# Patient Record
Sex: Male | Born: 1945 | Race: White | Hispanic: No | Marital: Single | Smoking: Former smoker
Health system: Southern US, Community
[De-identification: ages and names within clinical notes are randomized; demographics above are authoritative.]

## PROBLEM LIST (undated history)

## (undated) DIAGNOSIS — G629 Polyneuropathy, unspecified: Secondary | ICD-10-CM

## (undated) DIAGNOSIS — E7800 Pure hypercholesterolemia, unspecified: Secondary | ICD-10-CM

## (undated) DIAGNOSIS — G709 Myoneural disorder, unspecified: Secondary | ICD-10-CM

## (undated) DIAGNOSIS — I10 Essential (primary) hypertension: Secondary | ICD-10-CM

## (undated) DIAGNOSIS — G8929 Other chronic pain: Secondary | ICD-10-CM

## (undated) DIAGNOSIS — E119 Type 2 diabetes mellitus without complications: Secondary | ICD-10-CM

## (undated) DIAGNOSIS — E1140 Type 2 diabetes mellitus with diabetic neuropathy, unspecified: Secondary | ICD-10-CM

## (undated) HISTORY — PX: CHOLECYSTECTOMY: SHX55

## (undated) HISTORY — DX: Essential (primary) hypertension: I10

## (undated) HISTORY — PX: HERNIA REPAIR: SHX51

## (undated) HISTORY — DX: Type 2 diabetes mellitus with diabetic neuropathy, unspecified: E11.40

## (undated) HISTORY — DX: Pure hypercholesterolemia, unspecified: E78.00

---

## 2014-12-09 ENCOUNTER — Encounter (HOSPITAL_COMMUNITY): Payer: Self-pay

## 2014-12-09 ENCOUNTER — Emergency Department (HOSPITAL_COMMUNITY)
Admission: EM | Admit: 2014-12-09 | Discharge: 2014-12-09 | Disposition: A | Discharge status: Home / Self Care | Payer: Medicare Other | Attending: Emergency Medicine | Admitting: Emergency Medicine

## 2014-12-09 DIAGNOSIS — E7800 Pure hypercholesterolemia, unspecified: Secondary | ICD-10-CM | POA: Diagnosis not present

## 2014-12-09 DIAGNOSIS — G629 Polyneuropathy, unspecified: Secondary | ICD-10-CM | POA: Diagnosis not present

## 2014-12-09 DIAGNOSIS — Z760 Encounter for issue of repeat prescription: Secondary | ICD-10-CM | POA: Diagnosis not present

## 2014-12-09 DIAGNOSIS — I10 Essential (primary) hypertension: Secondary | ICD-10-CM | POA: Diagnosis not present

## 2014-12-09 DIAGNOSIS — Z794 Long term (current) use of insulin: Secondary | ICD-10-CM | POA: Diagnosis not present

## 2014-12-09 DIAGNOSIS — Z7982 Long term (current) use of aspirin: Secondary | ICD-10-CM | POA: Insufficient documentation

## 2014-12-09 DIAGNOSIS — E119 Type 2 diabetes mellitus without complications: Secondary | ICD-10-CM | POA: Diagnosis not present

## 2014-12-09 DIAGNOSIS — Z79899 Other long term (current) drug therapy: Secondary | ICD-10-CM | POA: Insufficient documentation

## 2014-12-09 DIAGNOSIS — G8929 Other chronic pain: Secondary | ICD-10-CM | POA: Insufficient documentation

## 2014-12-09 HISTORY — DX: Type 2 diabetes mellitus without complications: E11.9

## 2014-12-09 HISTORY — DX: Pure hypercholesterolemia, unspecified: E78.00

## 2014-12-09 HISTORY — DX: Other chronic pain: G89.29

## 2014-12-09 HISTORY — DX: Polyneuropathy, unspecified: G62.9

## 2014-12-09 HISTORY — DX: Essential (primary) hypertension: I10

## 2014-12-09 NOTE — ED Notes (Signed)
Pt reports is on opana for chronic pain for neuropathy.  Pt says he is prescribed opana but is unable to get a refill because his pcp is unavailable due to medical reasons.  Pt says has tried to get a prescription through the 2 other physicians that his pcp's office referred him to but they would not prescribe it.  Spoke with edp's and was told that they do not prescribe opana.  Notified pt and pt says he needs something comparable or he would,  "check myself in to the hospital for pain."

## 2014-12-09 NOTE — Discharge Instructions (Signed)
We are not able to refill the medication you are requesting for your pain. You will need to see your regular physician or the physician covering for him for medication.

## 2014-12-09 NOTE — ED Provider Notes (Signed)
CSN: [id number]     Arrival date & time 12/09/14  1434 History   First MD Initiated Contact with Patient 12/09/14 1633     Chief Complaint  Patient presents with  . Medication Refill     (Consider location/radiation/quality/duration/timing/severity/associated sxs/prior Treatment) The history is provided by the patient.   Allen Nichols is a 69 y.o. male who presents to the ED with chronic pain. He reports that he has been taking opana for chronic neuropathic pain but his PCP is out of work due to medical reasons and the partners in the practice refuse to prescribe the medication. Patient is here for medication refill. Hx of diabetes, HTN and currently taking Neurontin and Cymbalta in addition to the [place].  Past Medical History  Diagnosis Date  . Neuropathy ([place])   . Diabetes mellitus without complication ([place])   . Hypertension   . Hypercholesterolemia   . Chronic pain    Past Surgical History  Procedure Laterality Date  . Cholecystectomy    . Hernia repair     No family history on file. Social History  Substance Use Topics  . Smoking status: Never Smoker   . Smokeless tobacco: None  . Alcohol Use: No    Review of Systems Negative except as stated in HPI   Allergies  Review of patient's allergies indicates no known allergies.  Home Medications   Prior to Admission medications   Medication Sig Start Date End Date Taking? Authorizing Provider  aspirin EC 81 MG tablet Take 81 mg by mouth every evening.   Yes Historical Provider, MD  DULoxetine (CYMBALTA) 60 MG capsule Take 60 mg by mouth daily. 12/03/14  Yes Historical Provider, MD  gabapentin (NEURONTIN) 600 MG tablet Take 600 mg by mouth 4 (four) times daily.   Yes Historical Provider, MD  insulin NPH-regular Human (NOVOLIN 70/30) (70-30) 100 UNIT/ML injection Inject 20-45 Units into the skin. 45 units in the morning and evening with meals and 20 units at lunch   Yes Historical Provider, MD  lisinopril  (PRINIVIL,ZESTRIL) 10 MG tablet Take 10 mg by mouth 2 (two) times daily. 11/30/14  Yes Historical Provider, MD  OPANA ER, CRUSH RESISTANT, 10 MG T12A 12 hr tablet Take 10 mg by mouth every 12 (twelve) hours. 11/03/14  Yes Historical Provider, MD  rosuvastatin (CRESTOR) 20 MG tablet Take 20 mg by mouth every evening.  12/02/14  Yes Historical Provider, MD   BP 153/88 mmHg  Pulse 84  Temp(Src) 99 F (37.2 C) (Oral)  Resp 18  Ht 5\' 9"  (1.753 m)  Wt 92.987 kg  BMI 30.26 kg/m2  SpO2 99% Physical Exam  Constitutional: He is oriented to person, place, and time. He appears well-developed and well-nourished.  HENT:  Head: Normocephalic.  Neck: Neck supple.  Cardiovascular: Normal rate.   Pulmonary/Chest: Effort normal.  Neurological: He is alert and oriented to person, place, and time. No cranial nerve deficit.  Skin: Skin is warm and dry.  Psychiatric: He has a normal mood and affect. His behavior is normal.  Nursing note and vitals reviewed.   ED Course  Procedures  I discussed with the patient that I am unable to write Rx for Opana and will have Dr. Kathrynn Humble speak with him.  Dr. Kathrynn Humble in to discuss with the patient need for follow up with his PCP or the physicians covering for him to have his Rx refilled. Patient voices understanding.   MDM  69 y.o. male with request for refill of his Opana. Discussed  with patient unable to fill this medication.   Final diagnoses:  Encounter for medication refill       Ashley Murrain, NP 12/10/14 [id number]  Varney Biles, MD 12/10/14 [phone]

## 2016-04-04 ENCOUNTER — Encounter (INDEPENDENT_AMBULATORY_CARE_PROVIDER_SITE_OTHER): Payer: Self-pay | Admitting: Internal Medicine

## 2016-04-04 NOTE — Progress Notes (Signed)
Cardiology Office Note   Date:  04/06/2016   ID:  Allen Nichols, DOB [birth year], MRN [id number]  PCP:  Robert Bellow, MD  Cardiologist:   Jenkins Rouge, MD   No chief complaint on file.     History of Present Illness: Allen Nichols is a 71 y.o. male who presents for evaluation/consultation of cardiovascular risk. Referred by Dr Karie Kirks  Concern for  Long standing DM and age with elevated lipids and HTN. Poor BS control has lead to peripheral neuropathy. Sees Dr Forde Dandy for his diabetes Has constipation likely from opioid use and is being referred to [facility] for colonoscopy No history of documented cardiovascular disease MI, TIA, stroke , claudication or chest pain   Labs reviewed 11/17 Cr 1.88  LDL 112 HDL 50 A1c 10.4  Quit smoking over 20 years ago.  Sedentary has neuropathy in feet and hands No chest pain  Past Medical History:  Diagnosis Date  . Chronic pain   . Diabetes mellitus without complication ([place])   . Diabetic neuropathy ([place]) 04/05/2016  . Essential hypertension, benign 04/05/2016  . High cholesterol 04/05/2016  . Hypercholesterolemia   . Hypertension   . Neuropathy [facility])     Past Surgical History:  Procedure Laterality Date  . CHOLECYSTECTOMY    . HERNIA REPAIR       Current Outpatient Prescriptions  Medication Sig Dispense Refill  . aspirin EC 81 MG tablet Take 81 mg by mouth every evening.    . DULoxetine (CYMBALTA) 60 MG capsule Take 60 mg by mouth daily.    Marland Kitchen gabapentin (NEURONTIN) 600 MG tablet Take 600 mg by mouth 4 (four) times daily.    . insulin NPH-regular Human (NOVOLIN 70/30) (70-30) 100 UNIT/ML injection Inject 20-45 Units into the skin. 100units in am and 100pm    . linaclotide (LINZESS) 290 MCG CAPS capsule Take 1 capsule (290 mcg total) by mouth daily before breakfast. 30 capsule 6  . lisinopril (PRINIVIL,ZESTRIL) 10 MG tablet Take 10 mg by mouth 2 (two) times daily.    . OPANA ER, CRUSH RESISTANT, 10 MG T12A 12 hr tablet Take  10 mg by mouth every 12 (twelve) hours.    . rosuvastatin (CRESTOR) 20 MG tablet Take 20 mg by mouth every evening.      No current facility-administered medications for this visit.     Allergies:   Patient has no known allergies.    Social History:  The patient  reports that he quit smoking about 27 years ago. His smoking use included Cigarettes. He started smoking about 56 years ago. He smoked 2.00 packs per day. He has never used smokeless tobacco. He reports that he does not drink alcohol or use drugs.   Family History:  Mother with diabetes    ROS:  Please see the history of present illness.   Otherwise, review of systems are positive for none.   All other systems are reviewed and negative.    PHYSICAL EXAM: VS:  BP 126/64 (BP Location: Left Arm)   Pulse 92   Wt 230 lb (104.3 kg)   SpO2 99%   BMI 33.00 kg/m  , BMI Body mass index is 33 kg/m. Affect appropriate Healthy:  appears stated age 74: normal Neck supple with no adenopathy JVP normal no bruits no thyromegaly Lungs clear with no wheezing and good diaphragmatic motion Heart:  S1/S2 no murmur, no rub, gallop or click PMI normal Abdomen: benighn, BS positve, no tenderness, no AAA no bruit.  No  HSM or HJR Distal pulses intact with no bruits No edema Neuro non-focal Skin warm and dry No muscular weakness    EKG:  SR rate 86 RSR'l low voltage    Recent Labs: No results found for requested labs within last 8760 hours.    Lipid Panel No results found for: CHOL, TRIG, HDL, CHOLHDL, VLDL, LDLCALC, LDLDIRECT    Wt Readings from Last 3 Encounters:  04/06/16 230 lb (104.3 kg)  04/05/16 228 lb 14.4 oz (103.8 kg)  12/09/14 205 lb (93 kg)      Other studies Reviewed: Additional studies/ records that were reviewed today include: Notes Dr Karie Kirks ECG and labs .    ASSESSMENT AND PLAN:  1.  DM:  Discussed low carb diet.  Target hemoglobin A1c is 6.5 or less.  Continue current medications. Rx limited by  finances in past.  2. HTN:  Well controlled.  Continue current medications and low sodium Dash type diet.   3. CAD: high risk given poorly controlled DM will order f/u exercise myovue  4. Chol on statin labs with Dr Karie Kirks   Current medicines are reviewed at length with the patient today.  The patient concerns regarding medicines.  The following changes have been made:  no change  Labs/ tests ordered today include: Ex Myovue   Orders Placed This Encounter  Procedures  . EKG 12-Lead     Disposition:   FU with [place] PRN      Signed, Jenkins Rouge, MD  04/06/2016 2:13 PM    [facility] [address], [place], [place]  [address] Phone: [phone]; Fax: [phone]

## 2016-04-05 ENCOUNTER — Encounter (INDEPENDENT_AMBULATORY_CARE_PROVIDER_SITE_OTHER): Payer: Self-pay | Admitting: Internal Medicine

## 2016-04-05 ENCOUNTER — Ambulatory Visit (INDEPENDENT_AMBULATORY_CARE_PROVIDER_SITE_OTHER): Payer: Medicare HMO | Admitting: Internal Medicine

## 2016-04-05 ENCOUNTER — Ambulatory Visit (HOSPITAL_COMMUNITY)
Admission: RE | Admit: 2016-04-05 | Discharge: 2016-04-05 | Disposition: A | Discharge status: Home / Self Care | Payer: Medicare HMO | Source: Ambulatory Visit | Attending: Internal Medicine | Admitting: Internal Medicine

## 2016-04-05 ENCOUNTER — Encounter (INDEPENDENT_AMBULATORY_CARE_PROVIDER_SITE_OTHER): Payer: Self-pay

## 2016-04-05 VITALS — BP 128/64 | HR 96 | Temp 97.0°F | Ht 70.0 in | Wt 228.9 lb

## 2016-04-05 DIAGNOSIS — E7800 Pure hypercholesterolemia, unspecified: Secondary | ICD-10-CM

## 2016-04-05 DIAGNOSIS — E785 Hyperlipidemia, unspecified: Secondary | ICD-10-CM

## 2016-04-05 DIAGNOSIS — K5909 Other constipation: Secondary | ICD-10-CM

## 2016-04-05 DIAGNOSIS — E1159 Type 2 diabetes mellitus with other circulatory complications: Secondary | ICD-10-CM | POA: Insufficient documentation

## 2016-04-05 DIAGNOSIS — E1140 Type 2 diabetes mellitus with diabetic neuropathy, unspecified: Secondary | ICD-10-CM

## 2016-04-05 DIAGNOSIS — I10 Essential (primary) hypertension: Secondary | ICD-10-CM | POA: Diagnosis not present

## 2016-04-05 DIAGNOSIS — M549 Dorsalgia, unspecified: Secondary | ICD-10-CM | POA: Insufficient documentation

## 2016-04-05 DIAGNOSIS — E1169 Type 2 diabetes mellitus with other specified complication: Secondary | ICD-10-CM | POA: Insufficient documentation

## 2016-04-05 DIAGNOSIS — Z1211 Encounter for screening for malignant neoplasm of colon: Secondary | ICD-10-CM

## 2016-04-05 HISTORY — DX: Pure hypercholesterolemia, unspecified: E78.00

## 2016-04-05 HISTORY — DX: Type 2 diabetes mellitus with diabetic neuropathy, unspecified: E11.40

## 2016-04-05 HISTORY — DX: Essential (primary) hypertension: I10

## 2016-04-05 MED ORDER — LINACLOTIDE 290 MCG PO CAPS: 290.0000 ug | ORAL_CAPSULE | Freq: Every day | ORAL | 6 refills | Status: DC

## 2016-04-05 NOTE — Patient Instructions (Signed)
Colonoscopy.  The risks and benefits such as perforation, bleeding, and infection were reviewed with the patient and is agreeable. 

## 2016-04-05 NOTE — Progress Notes (Signed)
   Subjective:    Patient ID: Allen Nichols, male    DOB: 05-Nov-1945, 71 y.o.   MRN: [id number]  HPI Referred by DR. Knowlton for constipation/colonoscopy. His last colonoscopy was about 9-10 yrs ago and he reports it was normal.  He says he is due for a colonoscopy. He has a BM about every two. He will take a Doculax and he will have a BM in 2-3 days. No melena or BRRB.  Appetite is good. He has had weight gain.  Per records, hx of drug induced constipation. Hx of constipation since GB removed 8 yrs ago.  Does not exercise.   HA1C 10.4, H and H 14.2 and 43.4     Review of Systems Past Medical History:  Diagnosis Date  . Chronic pain   . Diabetes mellitus without complication ([place])   . Diabetic neuropathy ([place]) 04/05/2016  . Essential hypertension, benign 04/05/2016  . High cholesterol 04/05/2016  . Hypercholesterolemia   . Hypertension   . Neuropathy [facility])     Past Surgical History:  Procedure Laterality Date  . CHOLECYSTECTOMY    . HERNIA REPAIR      No Known Allergies  Current Outpatient Prescriptions on File Prior to Visit  Medication Sig Dispense Refill  . aspirin EC 81 MG tablet Take 81 mg by mouth every evening.    . DULoxetine (CYMBALTA) 60 MG capsule Take 60 mg by mouth daily.    Marland Kitchen gabapentin (NEURONTIN) 600 MG tablet Take 600 mg by mouth 4 (four) times daily.    . insulin NPH-regular Human (NOVOLIN 70/30) (70-30) 100 UNIT/ML injection Inject 20-45 Units into the skin. 100units in am and 100pm    . lisinopril (PRINIVIL,ZESTRIL) 10 MG tablet Take 10 mg by mouth 2 (two) times daily.    . OPANA ER, CRUSH RESISTANT, 10 MG T12A 12 hr tablet Take 10 mg by mouth every 12 (twelve) hours.    . rosuvastatin (CRESTOR) 20 MG tablet Take 20 mg by mouth every evening.      No current facility-administered medications on file prior to visit.        Objective:   Physical Exam Blood pressure 128/64, pulse 96, temperature 97 F (36.1 C), height 5\' 10"  (1.778 m), weight 228 lb  14.4 oz (103.8 kg). Alert and oriented. Skin warm and dry. Oral mucosa is moist.   . Sclera anicteric, conjunctivae is pink. Thyroid not enlarged. No cervical lymphadenopathy. Lungs clear. Heart regular rate and rhythm.  Abdomen is soft. Bowel sounds are positive. No hepatomegaly. No abdominal masses felt. No tenderness. BS +.  Abdomen distended but not tense.  No edema to lower extremities.        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 Am going to get a KUB. Rsd for LInzess 275mcg daily. Samples x 3 of Linzess given to patient.  Screening colonoscopy.  Am going to start him on LInzess 290mg  daily. Colonoscopy.The risks and benefits such as perforation, bleeding, and infection were reviewed with the patient and is agreeable.

## 2016-04-06 ENCOUNTER — Ambulatory Visit (INDEPENDENT_AMBULATORY_CARE_PROVIDER_SITE_OTHER): Payer: Medicare HMO | Admitting: Cardiovascular Disease

## 2016-04-06 ENCOUNTER — Encounter: Payer: Self-pay | Admitting: Cardiovascular Disease

## 2016-04-06 ENCOUNTER — Telehealth (INDEPENDENT_AMBULATORY_CARE_PROVIDER_SITE_OTHER): Payer: Self-pay | Admitting: *Deleted

## 2016-04-06 ENCOUNTER — Encounter (INDEPENDENT_AMBULATORY_CARE_PROVIDER_SITE_OTHER): Payer: Self-pay | Admitting: *Deleted

## 2016-04-06 VITALS — BP 126/64 | HR 92 | Wt 230.0 lb

## 2016-04-06 DIAGNOSIS — Z1211 Encounter for screening for malignant neoplasm of colon: Secondary | ICD-10-CM | POA: Insufficient documentation

## 2016-04-06 DIAGNOSIS — R0600 Dyspnea, unspecified: Secondary | ICD-10-CM | POA: Diagnosis not present

## 2016-04-06 DIAGNOSIS — I10 Essential (primary) hypertension: Secondary | ICD-10-CM | POA: Diagnosis not present

## 2016-04-06 MED ORDER — PEG 3350-KCL-NA BICARB-NACL 420 G PO SOLR: 4000.0000 mL | Freq: Once | ORAL | 0 refills | Status: AC

## 2016-04-06 NOTE — Patient Instructions (Signed)
Your physician recommends that you schedule a follow-up appointment in: as needed.we will call you with test results     Your physician has requested that you have en exercise stress myoview. For further information please visit [email or web address]. Please follow instruction sheet, as given.       Your physician recommends that you continue on your current medications as directed. Please refer to the Current Medication list given to you today.      Thank you for choosing [facility] !

## 2016-04-06 NOTE — Telephone Encounter (Signed)
Patient needs trilyte 

## 2016-04-19 ENCOUNTER — Other Ambulatory Visit (HOSPITAL_COMMUNITY): Payer: Medicare HMO

## 2016-04-19 ENCOUNTER — Encounter (HOSPITAL_COMMUNITY): Payer: Medicare HMO

## 2016-04-28 NOTE — Patient Instructions (Signed)
Allen Nichols  04/28/2016     @PREFPERIOPPHARMACY @   Your procedure is scheduled on  05/05/2016   Report to [facility] at  [place].M.  Call this number if you have problems the morning of surgery:  [phone]   Remember:  Do not eat food or drink liquids after midnight.  Take these medicines the morning of surgery with A SIP OF WATER  cymbalta, neurontin, lsinopril, OpanaER (if needed). Take 1/2 of your usual insulin dosage the night before your procedure. DO NOT take any medication for diabetes the morning of your procedure.   Do not wear jewelry, make-up or nail polish.  Do not wear lotions, powders, or perfumes, or deoderant.  Do not shave 48 hours prior to surgery.  Men may shave face and neck.  Do not bring valuables to the hospital.  [facility] is not responsible for any belongings or valuables.  Contacts, dentures or bridgework may not be worn into surgery.  Leave your suitcase in the car.  After surgery it may be brought to your room.  For patients admitted to the hospital, discharge time will be determined by your treatment team.  Patients discharged the day of surgery will not be allowed to drive home.   Name and phone number of your driver:   Family   Special instructions:  Follow the diet and prep instructions given to you by Dr Olevia Perches office.  Please read over the following fact sheets that you were given. Anesthesia Post-op Instructions and Care and Recovery After Surgery       Colonoscopy, Adult A colonoscopy is an exam to look at the entire large intestine. During the exam, a lubricated, bendable tube is inserted into the anus and then passed into the rectum, colon, and other parts of the large intestine. A colonoscopy is often done as a part of normal colorectal screening or in response to certain symptoms, such as anemia, persistent diarrhea, abdominal pain, and blood in the stool. The exam can help screen for and diagnose medical  problems, including:  Tumors.  Polyps.  Inflammation.  Areas of bleeding. Tell a health care provider about:  Any allergies you have.  All medicines you are taking, including vitamins, herbs, eye drops, creams, and over-the-counter medicines.  Any problems you or family members have had with anesthetic medicines.  Any blood disorders you have.  Any surgeries you have had.  Any medical conditions you have.  Any problems you have had passing stool. What are the risks? Generally, this is a safe procedure. However, problems may occur, including:  Bleeding.  A tear in the intestine.  A reaction to medicines given during the exam.  Infection (rare). What happens before the procedure? Eating and drinking restrictions  Follow instructions from your health care provider about eating and drinking, which may include:  A few days before the procedure - follow a low-fiber diet. Avoid nuts, seeds, dried fruit, raw fruits, and vegetables.  1-3 days before the procedure - follow a clear liquid diet. Drink only clear liquids, such as clear broth or bouillon, black coffee or tea, clear juice, clear soft drinks or sports drinks, gelatin dessert, and popsicles. Avoid any liquids that contain red or purple dye.  On the day of the procedure - do not eat or drink anything during the 2 hours before the procedure, or within the time period that your health care provider recommends. Bowel prep  If you were prescribed an oral bowel prep to clean out your colon:  Take it as told by your health care provider. Starting the day before your procedure, you will need to drink a large amount of medicated liquid. The liquid will cause you to have multiple loose stools until your stool is almost clear or light green.  If your skin or anus gets irritated from diarrhea, you may use these to relieve the irritation:  Medicated wipes, such as adult wet wipes with aloe and vitamin E.  A skin  soothing-product like petroleum jelly.  If you vomit while drinking the bowel prep, take a break for up to 60 minutes and then begin the bowel prep again. If vomiting continues and you cannot take the bowel prep without vomiting, call your health care provider. General instructions   Ask your health care provider about changing or stopping your regular medicines. This is especially important if you are taking diabetes medicines or blood thinners.  Plan to have someone take you home from the hospital or clinic. What happens during the procedure?  An IV tube may be inserted into one of your veins.  You will be given medicine to help you relax (sedative).  To reduce your risk of infection:  Your health care team will wash or sanitize their hands.  Your anal area will be washed with soap.  You will be asked to lie on your side with your knees bent.  Your health care provider will lubricate a long, thin, flexible tube. The tube will have a camera and a light on the end.  The tube will be inserted into your anus.  The tube will be gently eased through your rectum and colon.  Air will be delivered into your colon to keep it open. You may feel some pressure or cramping.  The camera will be used to take images during the procedure.  A small tissue sample may be removed from your body to be examined under a microscope (biopsy). If any potential problems are found, the tissue will be sent to a lab for testing.  If small polyps are found, your health care provider may remove them and have them checked for cancer cells.  The tube that was inserted into your anus will be slowly removed. The procedure may vary among health care providers and hospitals. What happens after the procedure?  Your blood pressure, heart rate, breathing rate, and blood oxygen level will be monitored until the medicines you were given have worn off.  Do not drive for 24 hours after the exam.  You may have a small  amount of blood in your stool.  You may pass gas and have mild abdominal cramping or bloating due to the air that was used to inflate your colon during the exam.  It is up to you to get the results of your procedure. Ask your health care provider, or the department performing the procedure, when your results will be ready. This information is not intended to replace advice given to you by your health care provider. Make sure you discuss any questions you have with your health care provider. Document Released: 12/17/1999 Document Revised: 10/20/2015 Document Reviewed: 03/02/2015 Elsevier Interactive Patient Education  2017 [employer]  Colonoscopy, Adult, Care After This sheet gives you information about how to care for yourself after your procedure. Your health care provider may also give you more specific instructions. If you have problems or questions, contact your health care provider. What can  I expect after the procedure? After the procedure, it is common to have:  A small amount of blood in your stool for 24 hours after the procedure.  Some gas.  Mild abdominal cramping or bloating. Follow these instructions at home: General instructions    For the first 24 hours after the procedure:  Do not drive or use machinery.  Do not sign important documents.  Do not drink alcohol.  Do your regular daily activities at a slower pace than normal.  Eat soft, easy-to-digest foods.  Rest often.  Take over-the-counter or prescription medicines only as told by your health care provider.  It is up to you to get the results of your procedure. Ask your health care provider, or the department performing the procedure, when your results will be ready. Relieving cramping and bloating   Try walking around when you have cramps or feel bloated.  Apply heat to your abdomen as told by your health care provider. Use a heat source that your health care provider recommends, such as a moist heat  pack or a heating pad.  Place a towel between your skin and the heat source.  Leave the heat on for 20-30 minutes.  Remove the heat if your skin turns bright red. This is especially important if you are unable to feel pain, heat, or cold. You may have a greater risk of getting burned. Eating and drinking   Drink enough fluid to keep your urine clear or pale yellow.  Resume your normal diet as instructed by your health care provider. Avoid heavy or fried foods that are hard to digest.  Avoid drinking alcohol for as long as instructed by your health care provider. Contact a health care provider if:  You have blood in your stool 2-3 days after the procedure. Get help right away if:  You have more than a small spotting of blood in your stool.  You pass large blood clots in your stool.  Your abdomen is swollen.  You have nausea or vomiting.  You have a fever.  You have increasing abdominal pain that is not relieved with medicine. This information is not intended to replace advice given to you by your health care provider. Make sure you discuss any questions you have with your health care provider. Document Released: 08/03/2003 Document Revised: 09/13/2015 Document Reviewed: 03/02/2015 Elsevier Interactive Patient Education  2017 [place] Anesthesia is a term that refers to techniques, procedures, and medicines that help a person stay safe and comfortable during a medical procedure. Monitored anesthesia care, or sedation, is one type of anesthesia. Your anesthesia specialist may recommend sedation if you will be having a procedure that does not require you to be unconscious, such as:  Cataract surgery.  A dental procedure.  A biopsy.  A colonoscopy. During the procedure, you may receive a medicine to help you relax (sedative). There are three levels of sedation:  Mild sedation. At this level, you may feel awake and relaxed. You will be able to  follow directions.  Moderate sedation. At this level, you will be sleepy. You may not remember the procedure.  Deep sedation. At this level, you will be asleep. You will not remember the procedure. The more medicine you are given, the deeper your level of sedation will be. Depending on how you respond to the procedure, the anesthesia specialist may change your level of sedation or the type of anesthesia to fit your needs. An anesthesia specialist will monitor you  closely during the procedure. Let your health care provider know about:  Any allergies you have.  All medicines you are taking, including vitamins, herbs, eye drops, creams, and over-the-counter medicines.  Any use of steroids (by mouth or as a cream).  Any problems you or family members have had with sedatives and anesthetic medicines.  Any blood disorders you have.  Any surgeries you have had.  Any medical conditions you have, such as sleep apnea.  Whether you are pregnant or may be pregnant.  Any use of cigarettes, alcohol, or street drugs. What are the risks? Generally, this is a safe procedure. However, problems may occur, including:  Getting too much medicine (oversedation).  Nausea.  Allergic reaction to medicines.  Trouble breathing. If this happens, a breathing tube may be used to help with breathing. It will be removed when you are awake and breathing on your own.  Heart trouble.  Lung trouble. Before the procedure Staying hydrated  Follow instructions from your health care provider about hydration, which may include:  Up to 2 hours before the procedure - you may continue to drink clear liquids, such as water, clear fruit juice, black coffee, and plain tea. Eating and drinking restrictions  Follow instructions from your health care provider about eating and drinking, which may include:  8 hours before the procedure - stop eating heavy meals or foods such as meat, fried foods, or fatty foods.  6  hours before the procedure - stop eating light meals or foods, such as toast or cereal.  6 hours before the procedure - stop drinking milk or drinks that contain milk.  2 hours before the procedure - stop drinking clear liquids. Medicines  Ask your health care provider about:  Changing or stopping your regular medicines. This is especially important if you are taking diabetes medicines or blood thinners.  Taking medicines such as aspirin and ibuprofen. These medicines can thin your blood. Do not take these medicines before your procedure if your health care provider instructs you not to. Tests and exams  You will have a physical exam.  You may have blood tests done to show:  How well your kidneys and liver are working.  How well your blood can clot.  General instructions  Plan to have someone take you home from the hospital or clinic.  If you will be going home right after the procedure, plan to have someone with you for 24 hours. What happens during the procedure?  Your blood pressure, heart rate, breathing, level of pain and overall condition will be monitored.  An IV tube will be inserted into one of your veins.  Your anesthesia specialist will give you medicines as needed to keep you comfortable during the procedure. This may mean changing the level of sedation.  The procedure will be performed. After the procedure  Your blood pressure, heart rate, breathing rate, and blood oxygen level will be monitored until the medicines you were given have worn off.  Do not drive for 24 hours if you received a sedative.  You may:  Feel sleepy, clumsy, or nauseous.  Feel forgetful about what happened after the procedure.  Have a sore throat if you had a breathing tube during the procedure.  Vomit. This information is not intended to replace advice given to you by your health care provider. Make sure you discuss any questions you have with your health care provider. Document  Released: 09/14/2004 Document Revised: 05/28/2015 Document Reviewed: 04/11/2015 Elsevier Interactive Patient Education  2017 [place], Care After These instructions provide you with information about caring for yourself after your procedure. Your health care provider may also give you more specific instructions. Your treatment has been planned according to current medical practices, but problems sometimes occur. Call your health care provider if you have any problems or questions after your procedure. What can I expect after the procedure? After your procedure, it is common to:  Feel sleepy for several hours.  Feel clumsy and have poor balance for several hours.  Feel forgetful about what happened after the procedure.  Have poor judgment for several hours.  Feel nauseous or vomit.  Have a sore throat if you had a breathing tube during the procedure. Follow these instructions at home: For at least 24 hours after the procedure:    Do not:  Participate in activities in which you could fall or become injured.  Drive.  Use heavy machinery.  Drink alcohol.  Take sleeping pills or medicines that cause drowsiness.  Make important decisions or sign legal documents.  Take care of children on your own.  Rest. Eating and drinking   Follow the diet that is recommended by your health care provider.  If you vomit, drink water, juice, or soup when you can drink without vomiting.  Make sure you have little or no nausea before eating solid foods. General instructions   Have a responsible adult stay with you until you are awake and alert.  Take over-the-counter and prescription medicines only as told by your health care provider.  If you smoke, do not smoke without supervision.  Keep all follow-up visits as told by your health care provider. This is important. Contact a health care provider if:  You keep feeling nauseous or you keep  vomiting.  You feel light-headed.  You develop a rash.  You have a fever. Get help right away if:  You have trouble breathing. This information is not intended to replace advice given to you by your health care provider. Make sure you discuss any questions you have with your health care provider. Document Released: 04/11/2015 Document Revised: 08/11/2015 Document Reviewed: 04/11/2015 Elsevier Interactive Patient Education  2017 [employer].

## 2016-05-02 ENCOUNTER — Encounter (HOSPITAL_COMMUNITY)
Admission: RE | Admit: 2016-05-02 | Discharge: 2016-05-02 | Disposition: A | Discharge status: Home / Self Care | Payer: Medicare HMO | Source: Ambulatory Visit | Attending: Internal Medicine | Admitting: Internal Medicine

## 2016-05-02 ENCOUNTER — Encounter (HOSPITAL_COMMUNITY): Payer: Self-pay

## 2016-05-05 ENCOUNTER — Ambulatory Visit (HOSPITAL_COMMUNITY): Admission: RE | Admit: 2016-05-05 | Payer: Medicare HMO | Source: Ambulatory Visit | Admitting: Internal Medicine

## 2016-05-05 ENCOUNTER — Encounter (HOSPITAL_COMMUNITY): Admission: RE | Payer: Self-pay | Source: Ambulatory Visit

## 2016-05-05 SURGERY — COLONOSCOPY WITH PROPOFOL
Anesthesia: Monitor Anesthesia Care

## 2016-05-25 ENCOUNTER — Encounter (INDEPENDENT_AMBULATORY_CARE_PROVIDER_SITE_OTHER): Payer: Self-pay

## 2017-10-24 ENCOUNTER — Ambulatory Visit (HOSPITAL_COMMUNITY)
Admission: RE | Admit: 2017-10-24 | Discharge: 2017-10-24 | Disposition: A | Discharge status: Home / Self Care | Payer: Medicare HMO | Source: Ambulatory Visit | Attending: Family Medicine | Admitting: Family Medicine

## 2017-10-24 ENCOUNTER — Other Ambulatory Visit (HOSPITAL_COMMUNITY): Payer: Self-pay | Admitting: Family Medicine

## 2017-10-24 DIAGNOSIS — R110 Nausea: Secondary | ICD-10-CM | POA: Diagnosis present

## 2017-10-24 DIAGNOSIS — D367 Benign neoplasm of other specified sites: Secondary | ICD-10-CM | POA: Diagnosis not present

## 2018-05-01 ENCOUNTER — Encounter (HOSPITAL_COMMUNITY): Payer: Self-pay

## 2018-05-01 ENCOUNTER — Observation Stay (HOSPITAL_COMMUNITY)
Admission: EM | Admit: 2018-05-01 | Discharge: 2018-05-02 | Disposition: A | Discharge status: Home / Self Care | Payer: Medicare HMO | Attending: Family Medicine | Admitting: Family Medicine

## 2018-05-01 ENCOUNTER — Emergency Department (HOSPITAL_COMMUNITY): Payer: Medicare HMO

## 2018-05-01 ENCOUNTER — Other Ambulatory Visit: Payer: Self-pay

## 2018-05-01 DIAGNOSIS — S01312A Laceration without foreign body of left ear, initial encounter: Secondary | ICD-10-CM | POA: Diagnosis present

## 2018-05-01 DIAGNOSIS — Z79899 Other long term (current) drug therapy: Secondary | ICD-10-CM | POA: Diagnosis not present

## 2018-05-01 DIAGNOSIS — Z87891 Personal history of nicotine dependence: Secondary | ICD-10-CM | POA: Diagnosis not present

## 2018-05-01 DIAGNOSIS — Y92512 Supermarket, store or market as the place of occurrence of the external cause: Secondary | ICD-10-CM | POA: Diagnosis not present

## 2018-05-01 DIAGNOSIS — Z794 Long term (current) use of insulin: Secondary | ICD-10-CM | POA: Insufficient documentation

## 2018-05-01 DIAGNOSIS — G8929 Other chronic pain: Secondary | ICD-10-CM | POA: Insufficient documentation

## 2018-05-01 DIAGNOSIS — Z7982 Long term (current) use of aspirin: Secondary | ICD-10-CM | POA: Insufficient documentation

## 2018-05-01 DIAGNOSIS — N183 Chronic kidney disease, stage 3 unspecified: Secondary | ICD-10-CM | POA: Diagnosis present

## 2018-05-01 DIAGNOSIS — E785 Hyperlipidemia, unspecified: Secondary | ICD-10-CM | POA: Diagnosis not present

## 2018-05-01 DIAGNOSIS — I951 Orthostatic hypotension: Secondary | ICD-10-CM | POA: Diagnosis present

## 2018-05-01 DIAGNOSIS — E1169 Type 2 diabetes mellitus with other specified complication: Secondary | ICD-10-CM | POA: Diagnosis present

## 2018-05-01 DIAGNOSIS — W1839XA Other fall on same level, initial encounter: Secondary | ICD-10-CM | POA: Insufficient documentation

## 2018-05-01 DIAGNOSIS — E7800 Pure hypercholesterolemia, unspecified: Secondary | ICD-10-CM | POA: Diagnosis not present

## 2018-05-01 DIAGNOSIS — E1122 Type 2 diabetes mellitus with diabetic chronic kidney disease: Secondary | ICD-10-CM | POA: Diagnosis not present

## 2018-05-01 DIAGNOSIS — I129 Hypertensive chronic kidney disease with stage 1 through stage 4 chronic kidney disease, or unspecified chronic kidney disease: Secondary | ICD-10-CM | POA: Diagnosis not present

## 2018-05-01 DIAGNOSIS — E1140 Type 2 diabetes mellitus with diabetic neuropathy, unspecified: Secondary | ICD-10-CM | POA: Insufficient documentation

## 2018-05-01 DIAGNOSIS — I10 Essential (primary) hypertension: Secondary | ICD-10-CM | POA: Diagnosis not present

## 2018-05-01 DIAGNOSIS — R55 Syncope and collapse: Secondary | ICD-10-CM | POA: Diagnosis present

## 2018-05-01 DIAGNOSIS — E1159 Type 2 diabetes mellitus with other circulatory complications: Secondary | ICD-10-CM | POA: Diagnosis present

## 2018-05-01 LAB — CBC
HCT: 40.7 % (ref 39.0–52.0)
Hemoglobin: 13.1 g/dL (ref 13.0–17.0)
MCH: 29 pg (ref 26.0–34.0)
MCHC: 32.2 g/dL (ref 30.0–36.0)
MCV: 90 fL (ref 80.0–100.0)
Platelets: 319 10*3/uL (ref 150–400)
RBC: 4.52 MIL/uL (ref 4.22–5.81)
RDW: 14.6 % (ref 11.5–15.5)
WBC: 9.7 10*3/uL (ref 4.0–10.5)
nRBC: 0 % (ref 0.0–0.2)

## 2018-05-01 LAB — BASIC METABOLIC PANEL
Anion gap: 8 (ref 5–15)
BUN: 24 mg/dL — ABNORMAL HIGH (ref 8–23)
CO2: 27 mmol/L (ref 22–32)
Calcium: 9.3 mg/dL (ref 8.9–10.3)
Chloride: 101 mmol/L (ref 98–111)
Creatinine, Ser: 1.58 mg/dL — ABNORMAL HIGH (ref 0.61–1.24)
GFR calc Af Amer: 50 mL/min — ABNORMAL LOW (ref >60)
GFR calc non Af Amer: 43 mL/min — ABNORMAL LOW (ref >60)
Glucose, Bld: 181 mg/dL — ABNORMAL HIGH (ref 70–99)
Potassium: 5 mmol/L (ref 3.5–5.1)
Sodium: 136 mmol/L (ref 135–145)

## 2018-05-01 LAB — HEMOGLOBIN A1C
Hgb A1c MFr Bld: 7.4 % — ABNORMAL HIGH (ref 4.8–5.6)
Mean Plasma Glucose: 165.68 mg/dL

## 2018-05-01 LAB — TROPONIN I: Troponin I: <0.03 ng/mL (ref <0.03)

## 2018-05-01 LAB — GLUCOSE, CAPILLARY: Glucose-Capillary: 72 mg/dL (ref 70–99)

## 2018-05-01 LAB — CBG MONITORING, ED: Glucose-Capillary: 252 mg/dL — ABNORMAL HIGH (ref 70–99)

## 2018-05-01 MED ORDER — DULOXETINE HCL 60 MG PO CPEP
60.0000 mg | ORAL_CAPSULE | Freq: Two times a day (BID) | ORAL | Status: DC
  Administered 2018-05-01 – 2018-05-02 (×2): 60 mg via ORAL
  Filled 2018-05-01 (×2): qty 1

## 2018-05-01 MED ORDER — INSULIN ASPART PROT & ASPART (70-30 MIX) 100 UNIT/ML ~~LOC~~ SUSP
50.0000 [IU] | Freq: Two times a day (BID) | SUBCUTANEOUS | Status: DC
  Administered 2018-05-02: 50 [IU] via SUBCUTANEOUS
  Filled 2018-05-01: qty 10

## 2018-05-01 MED ORDER — SODIUM CHLORIDE 0.9% FLUSH: 3.0000 mL | Freq: Once | INTRAVENOUS | Status: DC

## 2018-05-01 MED ORDER — ACETAMINOPHEN 650 MG RE SUPP: 650.0000 mg | Freq: Four times a day (QID) | RECTAL | Status: DC | PRN

## 2018-05-01 MED ORDER — SODIUM CHLORIDE 0.9 % IV BOLUS
1000.0000 mL | Freq: Once | INTRAVENOUS | Status: AC
  Administered 2018-05-01: 1000 mL via INTRAVENOUS

## 2018-05-01 MED ORDER — OXYCODONE HCL ER 10 MG PO T12A
20.0000 mg | EXTENDED_RELEASE_TABLET | Freq: Two times a day (BID) | ORAL | Status: DC
  Administered 2018-05-01 – 2018-05-02 (×2): 20 mg via ORAL
  Filled 2018-05-01 (×2): qty 2

## 2018-05-01 MED ORDER — ROSUVASTATIN CALCIUM 20 MG PO TABS
20.0000 mg | ORAL_TABLET | Freq: Every evening | ORAL | Status: DC
  Administered 2018-05-01: 20 mg via ORAL
  Filled 2018-05-01: qty 1

## 2018-05-01 MED ORDER — SODIUM CHLORIDE 0.9 % IV SOLN
INTRAVENOUS | Status: AC
  Administered 2018-05-01 – 2018-05-02 (×2): via INTRAVENOUS

## 2018-05-01 MED ORDER — INSULIN ASPART 100 UNIT/ML ~~LOC~~ SOLN: 0.0000 [IU] | Freq: Three times a day (TID) | SUBCUTANEOUS | Status: DC

## 2018-05-01 MED ORDER — INSULIN ASPART 100 UNIT/ML ~~LOC~~ SOLN: 0.0000 [IU] | Freq: Every day | SUBCUTANEOUS | Status: DC

## 2018-05-01 MED ORDER — ACETAMINOPHEN 325 MG PO TABS: 650.0000 mg | ORAL_TABLET | Freq: Four times a day (QID) | ORAL | Status: DC | PRN

## 2018-05-01 MED ORDER — LIDOCAINE HCL (PF) 1 % IJ SOLN
5.0000 mL | Freq: Once | INTRAMUSCULAR | Status: DC
  Filled 2018-05-01: qty 30

## 2018-05-01 MED ORDER — TETANUS-DIPHTH-ACELL PERTUSSIS 5-2.5-18.5 LF-MCG/0.5 IM SUSP
0.5000 mL | Freq: Once | INTRAMUSCULAR | Status: AC
  Administered 2018-05-01: 0.5 mL via INTRAMUSCULAR
  Filled 2018-05-01: qty 0.5

## 2018-05-01 MED ORDER — SODIUM CHLORIDE 0.9% FLUSH: 3.0000 mL | Freq: Two times a day (BID) | INTRAVENOUS | Status: DC

## 2018-05-01 NOTE — H&P (Signed)
History and Physical    Allen Nichols WNI:[id number] DOB: 1945-06-20 DOA: 05/01/2018  PCP: Lemmie Evens, MD  Patient coming from: [place]  I have personally briefly reviewed patient's old medical records in [place]  Chief Complaint: Syncope  HPI: Allen Nichols is a 73 y.o. male with medical history significant for insulin-dependent diabetes, hypertension, hyperlipidemia, and chronic pain presents to the ED after a syncopal episode.  Patient states earlier this morning around 8 AM he had an episode of lightheadedness when he stood up and walked to the kitchen from a lying position in which time he had a brace himself on the counter.  He did not fall or lose consciousness at that time.  This afternoon he went to the [employer] gas station.  He did not have the driver seat of his truck and was preparing to pump gas when he all of a sudden blacked out and fell.  He awoke approximately 1 minute later on the ground fully aware.  He had apparently hit his head as evidenced by a left ear laceration.    At the time of this episode, he denied any associated nausea, vomiting, chest pain, palpitations, dyspnea, loss of bowel/bladder control, focal weakness, sensory change, or confusion.  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  He says he has been feeling more thirsty than usual and says that he really does not drink any water, mostly soda.  He takes insulin for his diabetes and denies any recent low blood sugars.  Upon further questioning he does report similar episodes of lightheadedness occasionally when changing positions from lying or sitting to standing.  He denies any recent medication changes.  He denies any fevers, chills, diaphoresis, sick contacts, or recent travel.  He denies any dysuria or increased urinary frequency.  ED Course:  Initial vitals showed BP 103/66, pulse 95, RR 20, temp 97.8 Fahrenheit, SPO2 100% on room air.   Orthostatic vital signs were positive.  Labs are notable for BUN 24, creatinine 1.58, GFR 43, (2017: Creatinine 1.88, GFR 36) serum glucose 181, troponin I negative.  CBC was within normal limits.  CT head without contrast was negative for acute intracranial abnormalities, but did show atrophy with minimal small vessel chronic ischemic changes of deep cerebral white matter.  Chest x-ray with left-sided rib view were negative for displaced rib fracture, focal consolidation, effusion, or pneumothorax.  Patient was given 1 L normal saline and the hospitalist service was consulted for further evaluation and management of syncope.  Review of Systems: As per HPI otherwise 10 point review of systems negative.    Past Medical History:  Diagnosis Date   Chronic pain    Diabetes mellitus without complication ([place])    Diabetic neuropathy ([place]) 04/05/2016   Essential hypertension, benign 04/05/2016   High cholesterol 04/05/2016   Hypercholesterolemia    Hypertension    Neuropathy     Past Surgical History:  Procedure Laterality Date   CHOLECYSTECTOMY     HERNIA REPAIR       reports that he quit smoking about 29 years ago. His smoking use included cigarettes. He started smoking about 58 years ago. He smoked 2.00 packs per day. He has never used smokeless tobacco. He reports that he does not drink alcohol or use drugs.  No Known Allergies  History reviewed. No pertinent family history.   Prior to Admission medications   Medication Sig Start Date End Date Taking? Authorizing Provider  aspirin EC  81 MG tablet Take 81 mg by mouth every evening.    [provider]  DULoxetine (CYMBALTA) 60 MG capsule Take 60 mg by mouth daily. 12/03/14   [provider]  gabapentin (NEURONTIN) 600 MG tablet Take 600 mg by mouth 4 (four) times daily.    [provider]  insulin NPH-regular Human (NOVOLIN 70/30) (70-30) 100 UNIT/ML injection Inject 20-45 Units into the skin.  100units in am and 100pm    [provider]  linaclotide (LINZESS) 290 MCG CAPS capsule Take 1 capsule (290 mcg total) by mouth daily before breakfast. 04/05/16   Setzer, Rona Ravens, NP  lisinopril (PRINIVIL,ZESTRIL) 10 MG tablet Take 10 mg by mouth 2 (two) times daily. 11/30/14   [provider]  OPANA ER, CRUSH RESISTANT, 10 MG T12A 12 hr tablet Take 10 mg by mouth every 12 (twelve) hours. 11/03/14   [provider]  rosuvastatin (CRESTOR) 20 MG tablet Take 20 mg by mouth every evening.  12/02/14   [provider]    Physical Exam: Vitals:   05/01/18 1535 05/01/18 1800 05/01/18 1830 05/01/18 1900  BP:  117/62 126/64 136/76  Pulse:  88 84 86  Resp:  15    Temp:      TempSrc:      SpO2:  95% 95% 98%  Weight: 106.6 kg     Height: 5\' 10"  (1.778 m)       Constitutional: Elderly man resting supine in bed, NAD, calm, comfortable Head: Left ear laceration s/p repair with sutures in place Eyes: PERRL, EOMI, lids and conjunctivae normal ENMT: Mucous membranes are dry. Posterior pharynx clear of any exudate or lesions. Neck: normal, supple, no masses. Respiratory: clear to auscultation bilaterally, no wheezing, no crackles. Normal respiratory effort. No accessory muscle use.  Cardiovascular: Regular rate and rhythm, no murmurs / rubs / gallops. No extremity edema. 2+ pedal pulses. Abdomen: no tenderness, no masses palpated. No hepatosplenomegaly. Bowel sounds positive.  Musculoskeletal: no clubbing / cyanosis. No joint deformity upper and lower extremities. Good ROM, no contractures. Normal muscle tone.  Skin: Left ear laceration with sutures in place Neurologic: CN 2-12 grossly intact. Sensation intact, DTR normal. Strength 5/5 in all 4.  Slight intention tremor present without dysmetria. Psychiatric: Normal judgment and insight. Alert and oriented x 3. Normal mood.     Labs on Admission: I have personally reviewed following labs and imaging  studies  CBC: Recent Labs  Lab 05/01/18 1806  WBC 9.7  HGB 13.1  HCT 40.7  MCV 90.0  PLT 902   Basic Metabolic Panel: Recent Labs  Lab 05/01/18 1806  NA 136  K 5.0  CL 101  CO2 27  GLUCOSE 181*  BUN 24*  CREATININE 1.58*  CALCIUM 9.3   GFR: Estimated Creatinine Clearance: 52.4 mL/min (A) (by C-G formula based on SCr of 1.58 mg/dL (H)). Liver Function Tests: No results for input(s): AST, ALT, ALKPHOS, BILITOT, PROT, ALBUMIN in the last 168 hours. No results for input(s): LIPASE, AMYLASE in the last 168 hours. No results for input(s): AMMONIA in the last 168 hours. Coagulation Profile: No results for input(s): INR, PROTIME in the last 168 hours. Cardiac Enzymes: Recent Labs  Lab 05/01/18 1808  TROPONINI <0.03   BNP (last 3 results) No results for input(s): PROBNP in the last 8760 hours. HbA1C: No results for input(s): HGBA1C in the last 72 hours. CBG: Recent Labs  Lab 05/01/18 1557  GLUCAP 252*   Lipid Profile: No results for input(s):  CHOL, HDL, LDLCALC, TRIG, CHOLHDL, LDLDIRECT in the last 72 hours. Thyroid Function Tests: No results for input(s): TSH, T4TOTAL, FREET4, T3FREE, THYROIDAB in the last 72 hours. Anemia Panel: No results for input(s): VITAMINB12, FOLATE, FERRITIN, TIBC, IRON, RETICCTPCT in the last 72 hours. Urine analysis: No results found for: COLORURINE, APPEARANCEUR, [place], [place], [place], HGBUR, BILIRUBINUR, KETONESUR, PROTEINUR, UROBILINOGEN, NITRITE, LEUKOCYTESUR  Radiological Exams on Admission: Dg Ribs Unilateral W/chest Left  Result Date: 05/01/2018 CLINICAL DATA:  Loss of consciousness. Left-sided rib pain. EXAM: LEFT RIBS AND CHEST - 3+ VIEW COMPARISON:  None. FINDINGS: Frontal view of the chest and three views of left-sided ribs. Midline trachea. Normal heart size and mediastinal contours. No pleural effusion or pneumothorax. Clear lungs. No displaced rib fracture. Radiographic marker projects over the 12 left rib. IMPRESSION:  No displaced rib fracture, pleural fluid, or pneumothorax. No acute cardiopulmonary disease. Electronically Signed   By: Abigail Miyamoto M.D.   On: 05/01/2018 17:05   Ct Head Wo Contrast  Result Date: 05/01/2018 CLINICAL DATA:  Head trauma, passed out at [facility] today striking LEFT side of head, headache EXAM: CT HEAD WITHOUT CONTRAST TECHNIQUE: Contiguous axial images were obtained from the base of the skull through the vertex without intravenous contrast. Sagittal and coronal MPR images reconstructed from axial data set. COMPARISON:  None FINDINGS: Brain: Generalized atrophy. Normal ventricular morphology. No midline shift or mass effect. Minimal small vessel chronic ischemic changes of deep cerebral white matter. No intracranial hemorrhage, mass lesion, evidence of acute infarction, or extra-axial fluid collection. Vascular: Minimal atherosclerotic calcifications at the LEFT carotid artery at skull base. Skull: Intact Sinuses/Orbits: Minimal mucous within LEFT sphenoid sinus. Otherwise clear. Other: N/A IMPRESSION: Atrophy with minimal small vessel chronic ischemic changes of deep cerebral white matter. No acute intracranial abnormalities. Electronically Signed   By: Lavonia Dana M.D.   On: 05/01/2018 16:49    EKG: Independently reviewed. NSR, borderline LAD, motion artifact present, no acute ischemic changes.  Assessment/Plan Principal Problem:   Syncope Active Problems:   Hypertension associated with diabetes ([place])   Hyperlipidemia associated with type 2 diabetes mellitus ([place])   Type 2 diabetes mellitus with hyperlipidemia ([place])   CKD (chronic kidney disease), stage III ([place])   Laceration of left ear  Allen Nichols is a 73 y.o. male with medical history significant for insulin-dependent diabetes, hypertension, hyperlipidemia, and chronic pain who is admitted after syncopal episode.  Syncope: Orthostatic vital signs are positive and most likely etiology for syncopal episode in the setting of  inadequate hydration and antihypertensive use.  He denies any chest pain or palpitations.  Troponin and EKG are reassuring. -Admit to telemetry -Continue IV fluids overnight -Hold home lisinopril for now -Cycle cardiac enzymes q6h x 2 -Obtain echocardiogram -Repeat orthostatic vital signs in a.m.  Insulin-dependent type 2 diabetes: He has been taking Novolin 70/30 100 units twice daily with meals and recently started on Ozempic weekly. -Check A1c -Reduced NovoLog 70/30 50 units BIDAC, adjust as needed  Hypertension: -Hold home lisinopril for now with orthostatic hypotension  ?CKD stage III: BUN 24, creatinine 1.58, GFR 43 on admission, (2017: Creatinine 1.88, GFR 36).  Question CKD versus AKI in the setting of dehydration. -Continue IVFs overnight and recheck chemistry panel in a.m.  Hyperlipidemia: -Continue rosuvastatin  Left ear laceration: Occurred after fall from syncopal episode. -s/p LAC repair in the ED with sutures in place  Chronic pain and diabetic neuropathy: He is prescribed Xtampza ER 18 mg #60/month and Cymbalta 60 mg twice daily. -  Continue home [place] [facility] with hold parameters -Continue Cymbalta   DVT prophylaxis: SCDs  Code Status: Full code, confirmed with patient Family Communication: None present on admission Disposition Plan: Likely discharge to home pending clinical progress Consults called: None Admission status: Observation   Zada Finders MD [facility] Pager [phone]  If 7PM-7AM, please contact night-coverage [email or web address]  05/01/2018, 8:29 PM

## 2018-05-01 NOTE — ED Notes (Signed)
Attempted to call report for room 1426 nurse not assigned floor will return call when bed assigned.

## 2018-05-01 NOTE — ED Provider Notes (Signed)
Medical screening examination/treatment/procedure(s) were conducted as a shared visit with non-physician practitioner(s) and myself.  I personally evaluated the patient during the encounter.  EKG Interpretation  Date/Time:  Wednesday May 01 2018 15:33:06 EDT Ventricular Rate:  96 PR Interval:    QRS Duration: 96 QT Interval:  350 QTC Calculation: 443 R Axis:   -19 Text Interpretation:  Sinus rhythm Borderline left axis deviation Confirmed by Lacretia Leigh ([id number]) on 05/01/2018 3:35:44 PM 73 year old male presents after having syncopal event just prior to arrival.  Had episode of dizziness earlier today which resolved spontaneously.  On exam here he has no focal deficits.  Will obtain imaging as well as blood work and admit to the hospital service   Lacretia Leigh, MD 05/01/18 1623

## 2018-05-01 NOTE — ED Provider Notes (Signed)
[facility] DEPT Provider Note   CSN: [id number] Arrival date & time: 05/01/18  1520    History   Chief Complaint Chief Complaint  Patient presents with  . Loss of Consciousness    HPI Allen Nichols is a 73 y.o. male with a hx of HTN, hypercholesterolemia, diabetes mellitus w/ neuropathy, and former tobacco abuse who presents to the ED s/p syncopal episode which occurred just PTA. Patient states that he got out of his car to pump gas, placed his card inside payment slot, and suddenly passed out. He was not feeling dizzy, lightheaded, nauseaed or having chest pain, palpitations, or dyspnea immediately prior to the fall, he states he all the sudden woke up to the sound of horn honking while on the ground. LOC was brief, less than 30 seconds. Woke up feeling mostly at baseline, but did have L ear laceration & some discomfort to L posterior ribs as he collapsed to his left. Ambulatory since and feeling fairly well. He does mention that earlier today mid morning he returned from the grocery store and had an episode of lightheadedness/dizziness where he had to steady himself after position change, no syncope occurred at that time and he did not have these sxs just prior to passing out at [employer]. Otherwise no hx of similar. Denies fever, chills, numbness, weakness, chang in vision, seizure activity, nausea, vomiting, diarrhea, or abdominal pain. No new medications or recent med dosing changes.     HPI  Past Medical History:  Diagnosis Date  . Chronic pain   . Diabetes mellitus without complication ([place])   . Diabetic neuropathy ([place]) 04/05/2016  . Essential hypertension, benign 04/05/2016  . High cholesterol 04/05/2016  . Hypercholesterolemia   . Hypertension   . Neuropathy     Patient Active Problem List   Diagnosis Date Noted  . Special screening for malignant neoplasms, colon 04/06/2016  . Back pain 04/05/2016  . Diabetic neuropathy ([place]) 04/05/2016  .  Essential hypertension, benign 04/05/2016  . High cholesterol 04/05/2016    Past Surgical History:  Procedure Laterality Date  . CHOLECYSTECTOMY    . HERNIA REPAIR          Home Medications    Prior to Admission medications   Medication Sig Start Date End Date Taking? Authorizing Provider  aspirin EC 81 MG tablet Take 81 mg by mouth every evening.    [provider]  DULoxetine (CYMBALTA) 60 MG capsule Take 60 mg by mouth daily. 12/03/14   [provider]  gabapentin (NEURONTIN) 600 MG tablet Take 600 mg by mouth 4 (four) times daily.    [provider]  insulin NPH-regular Human (NOVOLIN 70/30) (70-30) 100 UNIT/ML injection Inject 20-45 Units into the skin. 100units in am and 100pm    [provider]  linaclotide (LINZESS) 290 MCG CAPS capsule Take 1 capsule (290 mcg total) by mouth daily before breakfast. 04/05/16   Setzer, Rona Ravens, NP  lisinopril (PRINIVIL,ZESTRIL) 10 MG tablet Take 10 mg by mouth 2 (two) times daily. 11/30/14   [provider]  OPANA ER, CRUSH RESISTANT, 10 MG T12A 12 hr tablet Take 10 mg by mouth every 12 (twelve) hours. 11/03/14   [provider]  rosuvastatin (CRESTOR) 20 MG tablet Take 20 mg by mouth every evening.  12/02/14   [provider]    Family History History reviewed. No pertinent family history.  Social History Social History   Tobacco Use  . Smoking status: Former Smoker  Packs/day: 2.00    Types: Cigarettes    Start date: 04/06/1960    Last attempt to quit: 04/06/1989    Years since quitting: 29.0  . Smokeless tobacco: Never Used  Substance Use Topics  . Alcohol use: No  . Drug use: No     Allergies   Patient has no known allergies.   Review of Systems Review of Systems  Constitutional: Negative for chills, diaphoresis and fever.  Eyes: Negative for visual disturbance.  Respiratory: Negative for shortness of breath.   Cardiovascular: Negative for chest pain.   Gastrointestinal: Negative for abdominal pain, diarrhea, nausea and vomiting.  Musculoskeletal:       + for L posterior rib pain  Skin: Positive for wound.  Neurological: Positive for dizziness (earlier this AM, resolved), syncope and light-headedness (earlier this AM resolved). Negative for tremors, seizures, facial asymmetry, speech difficulty, weakness, numbness and headaches.  All other systems reviewed and are negative.  Physical Exam Updated Vital Signs BP 103/66 (BP Location: Right Arm)   Pulse 95   Temp 97.8 F (36.6 C) (Oral)   Resp 20   Ht 5\' 10"  (1.778 m)   Wt 106.6 kg   SpO2 100%   BMI 33.72 kg/m   Physical Exam Vitals signs and nursing note reviewed.  Constitutional:      General: He is not in acute distress.    Appearance: He is well-developed. He is not toxic-appearing.  HENT:     Head: Normocephalic. No raccoon eyes or Battle's sign.     Right Ear: No hemotympanum.     Left Ear: No hemotympanum.     Ears:     Comments: L ear: Irregular laceration noted to the lobule. Most lateral portion is completely torn through anterior to posterior. Almost extends to tragus. Additional extension inferiorly. See below pictures for details. Bleeding controlled. No obvious FB.     Nose: No rhinorrhea.     Mouth/Throat:     Pharynx: Oropharynx is clear. Uvula midline.  Eyes:     General:        Right eye: No discharge.        Left eye: No discharge.     Extraocular Movements: Extraocular movements intact.     Conjunctiva/sclera: Conjunctivae normal.     Pupils: Pupils are equal, round, and reactive to light.  Neck:     Musculoskeletal: Normal range of motion and neck supple. No spinous process tenderness.  Cardiovascular:     Rate and Rhythm: Normal rate and regular rhythm.     Pulses:          Radial pulses are 2+ on the right side and 2+ on the left side.  Pulmonary:     Effort: Pulmonary effort is normal. No respiratory distress.     Breath sounds: Normal breath  sounds. No wheezing, rhonchi or rales.  Chest:     Chest wall: Tenderness (left posterior inferior chest wall tenderness to palpation without ovelrying ecchymosis or palpable crepitus/step off) present.  Abdominal:     General: There is no distension.     Palpations: Abdomen is soft.     Tenderness: There is no abdominal tenderness.  Musculoskeletal:     Comments: UEs: Abrasion noted to L wrist, no active bleeding, no ecchymosis. Intact AROM throughout. No point/focal bony tenderness.  Back: no midline tenderness.  LEs: Normal AROM. No point/focal bony tenderness.   Skin:    General: Skin is warm and dry.     Findings: No  rash.  Neurological:     Mental Status: He is alert.     Comments: Clear speech. CN III-XII grossly intact. Sensation grossly intact to bilateral upper/lower extremities. 5/5 symmetric grip strength. 5/5 strength with plantar/dorsiflexion bilaterally. Negative pronator drift.   Psychiatric:        Behavior: Behavior normal.        ED Treatments / Results  Labs (all labs ordered are listed, but only abnormal results are displayed) Labs Reviewed  BASIC METABOLIC PANEL - Abnormal; Notable for the following components:      Result Value   Glucose, Bld 181 (*)    BUN 24 (*)    Creatinine, Ser 1.58 (*)    GFR calc non Af Amer 43 (*)    GFR calc Af Amer 50 (*)    All other components within normal limits  CBG MONITORING, ED - Abnormal; Notable for the following components:   Glucose-Capillary 252 (*)    All other components within normal limits  CBC  TROPONIN I  URINALYSIS, ROUTINE W REFLEX MICROSCOPIC    EKG EKG Interpretation  Date/Time:  Wednesday May 01 2018 15:33:06 EDT Ventricular Rate:  96 PR Interval:    QRS Duration: 96 QT Interval:  350 QTC Calculation: 443 R Axis:   -19 Text Interpretation:  Sinus rhythm Borderline left axis deviation Confirmed by Lacretia Leigh ([id number]) on 05/01/2018 3:55:15 PM   Radiology Dg Ribs Unilateral W/chest  Left  Result Date: 05/01/2018 CLINICAL DATA:  Loss of consciousness. Left-sided rib pain. EXAM: LEFT RIBS AND CHEST - 3+ VIEW COMPARISON:  None. FINDINGS: Frontal view of the chest and three views of left-sided ribs. Midline trachea. Normal heart size and mediastinal contours. No pleural effusion or pneumothorax. Clear lungs. No displaced rib fracture. Radiographic marker projects over the 12 left rib. IMPRESSION: No displaced rib fracture, pleural fluid, or pneumothorax. No acute cardiopulmonary disease. Electronically Signed   By: Abigail Miyamoto M.D.   On: 05/01/2018 17:05   Ct Head Wo Contrast  Result Date: 05/01/2018 CLINICAL DATA:  Head trauma, passed out at [facility] today striking LEFT side of head, headache EXAM: CT HEAD WITHOUT CONTRAST TECHNIQUE: Contiguous axial images were obtained from the base of the skull through the vertex without intravenous contrast. Sagittal and coronal MPR images reconstructed from axial data set. COMPARISON:  None FINDINGS: Brain: Generalized atrophy. Normal ventricular morphology. No midline shift or mass effect. Minimal small vessel chronic ischemic changes of deep cerebral white matter. No intracranial hemorrhage, mass lesion, evidence of acute infarction, or extra-axial fluid collection. Vascular: Minimal atherosclerotic calcifications at the LEFT carotid artery at skull base. Skull: Intact Sinuses/Orbits: Minimal mucous within LEFT sphenoid sinus. Otherwise clear. Other: N/A IMPRESSION: Atrophy with minimal small vessel chronic ischemic changes of deep cerebral white matter. No acute intracranial abnormalities. Electronically Signed   By: Lavonia Dana M.D.   On: 05/01/2018 16:49    Procedures .Marland KitchenLaceration Repair Date/Time: 05/01/2018 7:13 PM Performed by: Amaryllis Dyke, PA-C Authorized by: Amaryllis Dyke, PA-C   Consent:    Consent obtained:  Verbal   Consent given by:  Patient   Risks discussed:  Infection, need for additional repair, nerve  damage, poor wound healing, poor cosmetic result, retained foreign body, pain, tendon damage and vascular damage   Alternatives discussed:  No treatment Anesthesia (see MAR for exact dosages):    Anesthesia method:  Local infiltration   Local anesthetic:  Lidocaine 1% w/o epi Laceration details:    Location:  Ear  Ear location:  L ear   Length (cm):  2.5 Repair type:    Repair type:  Simple Pre-procedure details:    Preparation:  Patient was prepped and draped in usual sterile fashion Exploration:    Hemostasis achieved with:  Direct pressure   Wound exploration: wound explored through full range of motion and entire depth of wound probed and visualized     Contaminated: no   Treatment:    Area cleansed with:  Betadine   Amount of cleaning:  Standard   Irrigation solution:  Sterile water   Irrigation method:  Pressure wash Skin repair:    Repair method:  Sutures   Suture size:  6-0   Suture material:  Prolene   Suture technique:  Simple interrupted   Number of sutures:  6 (5 anterior, 1 posterior) Approximation:    Approximation:  Close Post-procedure details:    Dressing:  Open (no dressing)   Patient tolerance of procedure:  Tolerated well, no immediate complications   (including critical care time)      Medications Ordered in ED Medications  sodium chloride flush (NS) 0.9 % injection 3 mL (has no administration in time range)    Initial Impression / Assessment and Plan / ED Course  I have reviewed the triage vital signs and the nursing notes.  Pertinent labs & imaging results that were available during my care of the patient were reviewed by me and considered in my medical decision making (see chart for details).   Patient presents to the ED s/p syncope w/ L ear laceration. Nontoxic, no apparent distress, vitals WNL. Exam notable for abnormally shaped L ear laceration & some L posterior chest wall tenderness to palpation without overlying ecchymosis or palpable  crepitus. No midline spinal tenderness. No focal neuro deficits. Primarily concerned for arrhythmia vs. Orthostatic hypotension. Plan for CT head, L rib/CXR, labs, EKG, cardiac monitoring. Orthostatic vitals. Anticipate admission for syncope w/ telemetry.  Work-up reviewed:  CBC: No leukocytosis/anemia.  BMP: BUN/creatinine elevated consistent w/ prerenal- no prior on record. No significant electrolyte derangement.  Trop: Negative EKG: No STEMI.  Orthostatics: positive.   Laceration: pressure irrigated, wound visualized in bloodless field without evidence of FB, repair per note above, will need suture removal in 5-7 days. Tdap updated.   Positive orthostatics, elevated BUN/creatinine, patient states he does not drink much water, likely component of dehydration, suspect this as most likely cause of patient's syncope, will consult for admission for syncope w/ continued monitoring.   19:27: CONSULT: Discussed case with hospitalist Dr. Posey Pronto- accepts admission.   This is a shared visit with supervising physician Dr. Zenia Resides who has independently evaluated patient & provided guidance in evaluation/management/disposition, in agreement with care   Final Clinical Impressions(s) / ED Diagnoses   Final diagnoses:  Syncope, unspecified syncope type  Laceration of left earlobe, initial encounter    ED Discharge Orders    None       Leafy Kindle 05/01/18 2108    Lacretia Leigh, MD 05/01/18 2250

## 2018-05-01 NOTE — ED Triage Notes (Signed)
States was pumping gas and passed out and cut left ear and abrasions to elbows. Left side pain.  And soreness to head voiced alert and oriented x 3. Moves all extremities.

## 2018-05-02 ENCOUNTER — Observation Stay (HOSPITAL_BASED_OUTPATIENT_CLINIC_OR_DEPARTMENT_OTHER): Payer: Medicare HMO

## 2018-05-02 DIAGNOSIS — S01312A Laceration without foreign body of left ear, initial encounter: Secondary | ICD-10-CM | POA: Diagnosis not present

## 2018-05-02 DIAGNOSIS — I951 Orthostatic hypotension: Secondary | ICD-10-CM | POA: Diagnosis present

## 2018-05-02 DIAGNOSIS — R55 Syncope and collapse: Secondary | ICD-10-CM

## 2018-05-02 LAB — URINALYSIS, ROUTINE W REFLEX MICROSCOPIC
Bilirubin Urine: NEGATIVE
Glucose, UA: NEGATIVE mg/dL
Hgb urine dipstick: NEGATIVE
Ketones, ur: NEGATIVE mg/dL
Leukocytes,Ua: NEGATIVE
Nitrite: NEGATIVE
Protein, ur: 30 mg/dL — AB
Specific Gravity, Urine: 1.027 (ref 1.005–1.030)
pH: 5 (ref 5.0–8.0)

## 2018-05-02 LAB — TROPONIN I
Troponin I: <0.03 ng/mL (ref <0.03)
Troponin I: <0.03 ng/mL (ref <0.03)

## 2018-05-02 LAB — BASIC METABOLIC PANEL
Anion gap: 8 (ref 5–15)
BUN: 22 mg/dL (ref 8–23)
CO2: 26 mmol/L (ref 22–32)
Calcium: 8.4 mg/dL — ABNORMAL LOW (ref 8.9–10.3)
Chloride: 103 mmol/L (ref 98–111)
Creatinine, Ser: 1.24 mg/dL (ref 0.61–1.24)
GFR calc Af Amer: >60 mL/min (ref >60)
GFR calc non Af Amer: 58 mL/min — ABNORMAL LOW (ref >60)
Glucose, Bld: 151 mg/dL — ABNORMAL HIGH (ref 70–99)
Potassium: 4.2 mmol/L (ref 3.5–5.1)
Sodium: 137 mmol/L (ref 135–145)

## 2018-05-02 LAB — ECHOCARDIOGRAM COMPLETE
Height: 70 in
Weight: 3608.49 oz

## 2018-05-02 LAB — GLUCOSE, CAPILLARY
Glucose-Capillary: 62 mg/dL — ABNORMAL LOW (ref 70–99)
Glucose-Capillary: 80 mg/dL (ref 70–99)
Glucose-Capillary: 93 mg/dL (ref 70–99)

## 2018-05-02 MED ORDER — PERFLUTREN LIPID MICROSPHERE
1.0000 mL | INTRAVENOUS | Status: AC | PRN
  Administered 2018-05-02: 2 mL via INTRAVENOUS
  Filled 2018-05-02: qty 10

## 2018-05-02 NOTE — Progress Notes (Signed)
Spoke with patient by phone (DM coordinator working remotely).  Reviewed with patient and reviewed insulin regimen with him. Patient is not regularly checking his CBGs so reinforced to check CBGs regularly and if symptomatic of hypoglycemia -reviewed hypoglycemia protocol.  Patient doesn't know what his CBGs have been but plans to restart checking CBGs @ home and reporting to MD on office visits. Patient has appointment @ Dr. Baldwin Crown office in May.  Thank you, Nani Gasser. Clifton Kovacic, RN, MSN, CDE  Diabetes Coordinator Inpatient Glycemic Control Team Team Pager [phone] (8am-5pm) 05/02/2018 2:01 PM

## 2018-05-02 NOTE — Care Management Obs Status (Signed)
[facility] NOTIFICATION   Patient Details  Name: Reymundo Winship MRN: [id number] Date of Birth: [birth year]   Medicare Observation Status Notification Given:  Yes    Purcell Mouton, RN 05/02/2018, 1:43 PM

## 2018-05-02 NOTE — Progress Notes (Signed)
Pt given discharge instructions and all questions answered. Girlfriend will be driving him; home.

## 2018-05-02 NOTE — Discharge Summary (Addendum)
Physician Discharge Summary  Laval Cafaro KXF:[id number] DOB: [birth year] DOA: 05/01/2018  PCP: Lemmie Evens, MD  Admit date: 05/01/2018 Discharge date: 05/02/2018  Admitted From: Home Disposition: Home  Recommendations for Outpatient Follow-up:  1. Follow up with PCP in 1-2 weeks 2. Please obtain BMP/CBC in one week 3. Please follow up on the following pending results:  Home Health: No Equipment/Devices: None  Discharge Condition: Stable CODE STATUS: Full code Diet recommendation: Heart healthy  Brief/Interim Summary: Allen Nichols is a 73 y.o. male with medical history significant for insulin-dependent diabetes, hypertension, hyperlipidemia, and chronic pain presented to the ED on 05/01/2018 after a syncopal episode.  Patient states earlier this morning around 8 AM he had an episode of lightheadedness when he stood up and walked to the kitchen from a lying position in which time he had a brace himself on the counter.  He did not fall or lose consciousness at that time.  This afternoon he went to the [place] gas station.  He did not have the driver seat of his truck and was preparing to pump gas when he all of a sudden blacked out and fell.  He awoke approximately 1 minute later on the ground fully aware.  He had apparently hit his head as evidenced by a left ear laceration.    At the time of this episode, he denied any associated nausea, vomiting, chest pain, palpitations, dyspnea, loss of bowel/bladder control, focal weakness, sensory change, or confusion.  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  He said he has been feeling more thirsty than usual and that he really does not drink any water, but mostly soda.   Upon arrival to the emergency department, he was slightly hypotensive and he was positive for orthostatic hypotension as well.  He was admitted to hospital service for syncope and further work-up and evaluation.  He  received some IV fluid bolus of 1 L in the emergency department and was continued on IV fluids.  He was monitored on telemetry during his short hospitalization and there was no event.  Transthoracic echo did not show any valvular defect either.  When seen earlier today, he had no complaints.  He has not had any more dizziness or any other complaint.  Patient now understands that he needs to drink more water than soda to prevent such episodes in the future.  He is in agreement with the discharge home today.  Discharge Diagnoses:  Principal Problem:   Syncope Active Problems:   Hypertension associated with diabetes ([place])   Hyperlipidemia associated with type 2 diabetes mellitus ([facility])   Type 2 diabetes mellitus with hyperlipidemia ([place])   CKD (chronic kidney disease), stage III ([place])   Laceration of left ear   Orthostatic hypotension    Discharge Instructions  Discharge Instructions    Discharge patient   Complete by:  As directed    Discharge disposition:  01-Home or Self Care   Discharge patient date:  05/02/2018      Follow-up Information    Lemmie Evens, MD Follow up in 1 week(s).   Specialty:  Family Medicine Contact information: [place] [id number] [phone]          No Known Allergies  Allergies as of 05/02/2018      Reactions   Latex Rash   Tape Rash      Consultations:  None   Procedures/Studies: Dg Ribs Unilateral W/chest Left  Result Date: 05/01/2018 CLINICAL  DATA:  Loss of consciousness. Left-sided rib pain. EXAM: LEFT RIBS AND CHEST - 3+ VIEW COMPARISON:  None. FINDINGS: Frontal view of the chest and three views of left-sided ribs. Midline trachea. Normal heart size and mediastinal contours. No pleural effusion or pneumothorax. Clear lungs. No displaced rib fracture. Radiographic marker projects over the 12 left rib. IMPRESSION: No displaced rib fracture, pleural fluid, or pneumothorax. No acute cardiopulmonary disease.  Electronically Signed   By: Abigail Miyamoto M.D.   On: 05/01/2018 17:05   Ct Head Wo Contrast  Result Date: 05/01/2018 CLINICAL DATA:  Head trauma, passed out at [facility] today striking LEFT side of head, headache EXAM: CT HEAD WITHOUT CONTRAST TECHNIQUE: Contiguous axial images were obtained from the base of the skull through the vertex without intravenous contrast. Sagittal and coronal MPR images reconstructed from axial data set. COMPARISON:  None FINDINGS: Brain: Generalized atrophy. Normal ventricular morphology. No midline shift or mass effect. Minimal small vessel chronic ischemic changes of deep cerebral white matter. No intracranial hemorrhage, mass lesion, evidence of acute infarction, or extra-axial fluid collection. Vascular: Minimal atherosclerotic calcifications at the LEFT carotid artery at skull base. Skull: Intact Sinuses/Orbits: Minimal mucous within LEFT sphenoid sinus. Otherwise clear. Other: N/A IMPRESSION: Atrophy with minimal small vessel chronic ischemic changes of deep cerebral white matter. No acute intracranial abnormalities. Electronically Signed   By: Lavonia Dana M.D.   On: 05/01/2018 16:49      Subjective: Seen and examined.  No complaints.  Feels better.   Discharge Exam: Vitals:   05/01/18 2210 05/02/18 0500  BP: (!) 151/80 (!) 170/80  Pulse: 86 74  Resp: 15 14  Temp: 97.9 F (36.6 C) 98.1 F (36.7 C)  SpO2: 100% 99%   Vitals:   05/01/18 2210 05/01/18 2300 05/02/18 0500 05/02/18 0500  BP: (!) 151/80   (!) 170/80  Pulse: 86   74  Resp: 15   14  Temp: 97.9 F (36.6 C)   98.1 F (36.7 C)  TempSrc: Oral   Oral  SpO2: 100%   99%  Weight:  102.4 kg 102.3 kg   Height:  5\' 10"  (1.778 m)      General: Pt is alert, awake, not in acute distress Cardiovascular: RRR, S1/S2 +, no rubs, no gallops Respiratory: CTA bilaterally, no wheezing, no rhonchi Abdominal: Soft, NT, ND, bowel sounds + Extremities: no edema, no cyanosis    The results of significant  diagnostics from this hospitalization (including imaging, microbiology, ancillary and laboratory) are listed below for reference.     Microbiology: No results found for this or any previous visit (from the past 240 hour(s)).   Labs: BNP (last 3 results) No results for input(s): BNP in the last 8760 hours. Basic Metabolic Panel: Recent Labs  Lab 05/01/18 1806 05/02/18 0031  NA 136 137  K 5.0 4.2  CL 101 103  CO2 27 26  GLUCOSE 181* 151*  BUN 24* 22  CREATININE 1.58* 1.24  CALCIUM 9.3 8.4*   Liver Function Tests: No results for input(s): AST, ALT, ALKPHOS, BILITOT, PROT, ALBUMIN in the last 168 hours. No results for input(s): LIPASE, AMYLASE in the last 168 hours. No results for input(s): AMMONIA in the last 168 hours. CBC: Recent Labs  Lab 05/01/18 1806  WBC 9.7  HGB 13.1  HCT 40.7  MCV 90.0  PLT 319   Cardiac Enzymes: Recent Labs  Lab 05/01/18 1808 05/02/18 0031 05/02/18 0457  TROPONINI <0.03 <0.03 <0.03   BNP: Invalid input(s): POCBNP CBG:  Recent Labs  Lab 05/01/18 1557 05/01/18 2159 05/01/18 2240 05/02/18 0456 05/02/18 0904  GLUCAP 252* 62* 72 80 93   D-Dimer No results for input(s): DDIMER in the last 72 hours. Hgb A1c Recent Labs    05/01/18 1806  HGBA1C 7.4*   Lipid Profile No results for input(s): CHOL, HDL, LDLCALC, TRIG, CHOLHDL, LDLDIRECT in the last 72 hours. Thyroid function studies No results for input(s): TSH, T4TOTAL, T3FREE, THYROIDAB in the last 72 hours.  Invalid input(s): FREET3 Anemia work up No results for input(s): VITAMINB12, FOLATE, FERRITIN, TIBC, IRON, RETICCTPCT in the last 72 hours. Urinalysis    Component Value Date/Time   COLORURINE YELLOW 05/02/2018 [place] 05/02/2018 0614   LABSPEC 1.027 05/02/2018 0614   PHURINE 5.0 05/02/2018 0614   GLUCOSEU NEGATIVE 05/02/2018 0614   HGBUR NEGATIVE 05/02/2018 0614   [place] NEGATIVE 05/02/2018 [place] 05/02/2018 0614    PROTEINUR 30 (A) 05/02/2018 0614   NITRITE NEGATIVE 05/02/2018 0614   LEUKOCYTESUR NEGATIVE 05/02/2018 0614   Sepsis Labs Invalid input(s): PROCALCITONIN,  WBC,  LACTICIDVEN Microbiology No results found for this or any previous visit (from the past 240 hour(s)).   Time coordinating discharge: Over 30 minutes  SIGNED:   Darliss Cheney, MD  [facility] 05/02/2018, 2:46 PM Pager [phone]  If 7PM-7AM, please contact night-coverage [email or web address] Password [email or web address]

## 2018-05-02 NOTE — Discharge Instructions (Signed)
Syncope Syncope is when you pass out (faint) for a short time. It is caused by a sudden decrease in blood flow to the brain. Signs that you may be about to pass out include:  Feeling dizzy or light-headed.  Feeling sick to your stomach (nauseous).  Seeing all white or all black.  Having cold, clammy skin. If you pass out, get help right away. Call your local emergency services (911 in the [place] Do not drive yourself to the hospital. Follow these instructions at home: Watch for any changes in your symptoms. Take these actions to stay safe and help with your symptoms: Lifestyle  Do not drive, use machinery, or play sports until your doctor says it is okay.  Do not drink alcohol.  Do not use any products that contain nicotine or tobacco, such as cigarettes and e-cigarettes. If you need help quitting, ask your doctor.  Drink enough fluid to keep your pee (urine) pale yellow. General instructions  Take over-the-counter and prescription medicines only as told by your doctor.  If you are taking blood pressure or heart medicine, sit up and stand up slowly. Spend a few minutes getting ready to sit and then stand. This can help you feel less dizzy.  Have someone stay with you until you feel stable.  If you start to feel like you might pass out, lie down right away and raise (elevate) your feet above the level of your heart. Breathe deeply and steadily. Wait until all of the symptoms are gone.  Keep all follow-up visits as told by your doctor. This is important. Get help right away if:  You have a very bad headache.  You pass out once or more than once.  You have pain in your chest, belly, or back.  You have a very fast or uneven heartbeat (palpitations).  It hurts to breathe.  You are bleeding from your mouth or your bottom (rectum).  You have black or tarry poop (stool).  You have jerky movements that you cannot control (seizure).  You are confused.  You have trouble  walking.  You are very weak.  You have vision problems. These symptoms may be an emergency. Do not wait to see if the symptoms will go away. Get medical help right away. Call your local emergency services (911 in the [place] Do not drive yourself to the hospital. Summary  Syncope is when you pass out (faint) for a short time. It is caused by a sudden decrease in blood flow to the brain.  Signs that you may be about to faint include feeling dizzy, light-headed, or sick to your stomach, seeing all white or all black, or having cold, clammy skin.  If you start to feel like you might pass out, lie down right away and raise (elevate) your feet above the level of your heart. Breathe deeply and steadily. Wait until all of the symptoms are gone. This information is not intended to replace advice given to you by your health care provider. Make sure you discuss any questions you have with your health care provider. Document Released: 06/07/2007 Document Revised: 01/31/2017 Document Reviewed: 01/31/2017 Elsevier Interactive Patient Education  2019 [employer]  

## 2018-05-02 NOTE — Progress Notes (Signed)
  Echocardiogram 2D Echocardiogram has been performed.  Darlina Sicilian M 05/02/2018, 10:24 AM

## 2018-05-02 NOTE — Evaluation (Signed)
Physical Therapy Evaluation Patient Details Name: Allen Nichols MRN: [id number] DOB: 04-23-1945 Today's Date: 05/02/2018   History of Present Illness  73 yo male admitted to ED on 4/9 s/p fall due to syncopal episode. Pt with L ear laceration, sutured closed, and L rib pain. CT head - for acute findings, CXR - for rib fracture or displacement. PMH includes chronic pain, DM, neuropathy, HTN.   Clinical Impression   Pt presents with history of syncopal episode/fall. Pt with WFL strength, mobility, gait, and activity tolerance. Pt with no complaints of dizziness with changes in position as he did on day of fall, states this rarely happens to him. Pt educated on orthostatic hypotension, and PT informed pt to watch for dizziness with supine to sit and sit to stand, and if he feels dizzy, to remain in position until dizziness passes and steady self. Pt also educated on head trauma and vestibular dysfunction. PT told pt that if he began to have vertigo-like symptoms to reach out to PCP. Pt to d/c home with no recommendation for follow-up.     Follow Up Recommendations No PT follow up    Equipment Recommendations  None recommended by PT    Recommendations for Other Services       Precautions / Restrictions Precautions Precautions: Fall Restrictions Weight Bearing Restrictions: No      Mobility  Bed Mobility Overal bed mobility: Needs Assistance Bed Mobility: Supine to Sit;Sit to Supine     Supine to sit: Supervision Sit to supine: Supervision   General bed mobility comments: Supervision for safety, increased time to perform.   Transfers Overall transfer level: Needs assistance Equipment used: None Transfers: Sit to/from Stand Sit to Stand: Supervision         General transfer comment: supervision for safety, pt stood without difficulty.   Ambulation/Gait Ambulation/Gait assistance: Supervision Gait Distance (Feet): 250 Feet Assistive device: IV Pole;None Gait  Pattern/deviations: Step-through pattern;Decreased stride length Gait velocity: WFL   General Gait Details: supervision for safety, pt originally using IV pole to walk, but PT encouraged no UE support to determine safety of gait. Pt with short steps, otherwise gait WNL.  Stairs Stairs: (NT - pt at basline level of mobility, pt to have no difficulty with stair navigation upon d/c home)          Wheelchair Mobility    Modified Rankin (Stroke Patients Only)       Balance Overall balance assessment: Mild deficits observed, not formally tested                           High level balance activites: Head turns;Direction changes High Level Balance Comments: WFL gait with vertical and horizontal head turns, direction changes.             Pertinent Vitals/Pain Pain Assessment: No/denies pain    Home Living Family/patient expects to be discharged to:: Private residence Living Arrangements: Spouse/significant other Available Help at Discharge: Family;Available 24 hours/day(lives with girlfriend) Type of Home: House Home Access: Stairs to enter   [employer] of Steps: 2-4 Home Layout: One level Home Equipment: None      Prior Function Level of Independence: Independent         Comments: Pt states he does not require any assist with mobility or ADLs at home. Pt is retired, and enjoys working on cars with son.      Hand Dominance   Dominant Hand: Right    Extremity/Trunk Assessment  Upper Extremity Assessment Upper Extremity Assessment: Overall WFL for tasks assessed    Lower Extremity Assessment Lower Extremity Assessment: Overall WFL for tasks assessed(Pt with body-wide tremors with movement, especially with changes in position. Pt states this is baseline. )    Cervical / Trunk Assessment Cervical / Trunk Assessment: Normal  Communication   Communication: No difficulties  Cognition Arousal/Alertness: Awake/alert Behavior During  Therapy: WFL for tasks assessed/performed Overall Cognitive Status: Within Functional Limits for tasks assessed                                        General Comments General comments (skin integrity, edema, etc.): no dizziness reported with ambulation. Smooth pursuits with overcorrective saccades when tracking to R, but this may have been difficulty with instruction following. Pt with no complaints of vertigo-like symptoms with change in position.    Exercises     Assessment/Plan    PT Assessment Patent does not need any further PT services  PT Problem List         PT Treatment Interventions      PT Goals (Current goals can be found in the Care Plan section)  Acute Rehab PT Goals Patient Stated Goal: go home soon PT Goal Formulation: With patient Time For Goal Achievement: 05/02/18 Potential to Achieve Goals: Good    Frequency     Barriers to discharge        Co-evaluation               AM-PAC PT "6 Clicks" Mobility  Outcome Measure Help needed turning from your back to your side while in a flat bed without using bedrails?: None Help needed moving from lying on your back to sitting on the side of a flat bed without using bedrails?: None Help needed moving to and from a bed to a chair (including a wheelchair)?: None Help needed standing up from a chair using your arms (e.g., wheelchair or bedside chair)?: None Help needed to walk in hospital room?: None Help needed climbing 3-5 steps with a railing? : A Little 6 Click Score: 23    End of Session   Activity Tolerance: Patient tolerated treatment well Patient left: in bed;with call bell/phone within reach Nurse Communication: Mobility status PT Visit Diagnosis: Other abnormalities of gait and mobility (R26.89)    Time: 3557-3220 PT Time Calculation (min) (ACUTE ONLY): 14 min   Charges:   PT Evaluation $PT Eval Low Complexity: 1 Low          Julien Girt, PT Acute Rehabilitation  Services Pager [phone]  Office [phone]   Gala Padovano D Arturo Sofranko 05/02/2018, 2:22 PM

## 2019-01-26 ENCOUNTER — Ambulatory Visit: Payer: Self-pay

## 2019-06-05 ENCOUNTER — Encounter (INDEPENDENT_AMBULATORY_CARE_PROVIDER_SITE_OTHER): Payer: Self-pay | Admitting: Gastroenterology

## 2019-07-14 ENCOUNTER — Ambulatory Visit (INDEPENDENT_AMBULATORY_CARE_PROVIDER_SITE_OTHER): Payer: Medicare HMO | Admitting: Gastroenterology

## 2019-07-16 ENCOUNTER — Ambulatory Visit (INDEPENDENT_AMBULATORY_CARE_PROVIDER_SITE_OTHER): Payer: Medicare HMO | Admitting: Gastroenterology

## 2019-07-31 ENCOUNTER — Ambulatory Visit (INDEPENDENT_AMBULATORY_CARE_PROVIDER_SITE_OTHER): Payer: Medicare HMO | Admitting: Gastroenterology

## 2019-07-31 ENCOUNTER — Encounter (INDEPENDENT_AMBULATORY_CARE_PROVIDER_SITE_OTHER): Payer: Self-pay | Admitting: Gastroenterology

## 2019-07-31 ENCOUNTER — Other Ambulatory Visit: Payer: Self-pay

## 2019-07-31 ENCOUNTER — Encounter (INDEPENDENT_AMBULATORY_CARE_PROVIDER_SITE_OTHER): Payer: Self-pay | Admitting: *Deleted

## 2019-07-31 ENCOUNTER — Telehealth (INDEPENDENT_AMBULATORY_CARE_PROVIDER_SITE_OTHER): Payer: Self-pay | Admitting: *Deleted

## 2019-07-31 DIAGNOSIS — R195 Other fecal abnormalities: Secondary | ICD-10-CM | POA: Diagnosis not present

## 2019-07-31 DIAGNOSIS — R42 Dizziness and giddiness: Secondary | ICD-10-CM | POA: Insufficient documentation

## 2019-07-31 DIAGNOSIS — K5903 Drug induced constipation: Secondary | ICD-10-CM | POA: Diagnosis not present

## 2019-07-31 DIAGNOSIS — K5900 Constipation, unspecified: Secondary | ICD-10-CM | POA: Insufficient documentation

## 2019-07-31 MED ORDER — PLENVU 140 G PO SOLR: 1.0000 | Freq: Once | ORAL | 0 refills | Status: AC

## 2019-07-31 NOTE — Progress Notes (Signed)
[name], M.D. Gastroenterology & Hepatology [facility] [address] [place], [place] [address] Primary Care Physician: Lemmie Evens, MD [place] [id number]  Referring MD: Beverely Risen NP-C   I will communicate my assessment and recommendations to the referring MD via EMR. Note: Occasional unusual wording and randomly placed punctuation marks may result from the use of speech recognition technology to transcribe this document"  Chief Complaint: Positive occult blood in stool  History of Present Illness: Allen Nichols is a 74 y.o. 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  The patient states that for the last 3 months he has had a few episodes of lightheadedness.  Actually, one time he had a syncopal episode.  He believes that his blood sugars have been in the 60s to 70s recently as he had some adjustments to his medication and he has tried to control his diet more.  The patient denies having any other complaint at the moment. The patient denies having any nausea, vomiting, fever, chills, hematochezia, melena, hematemesis, abdominal distention, abdominal pain, diarrhea, jaundice, chest pain, shortness of breath, pruritus or weight loss.  Comes here without referral stating he had positive occult blood in his stool.  Notably, he was seen in the clinic 3 years ago for episodes of constipation which were considered to be secondary to his chronic narcotic use.  He was ordered a colonoscopy as he had not had one for the last 13 years but he did not schedule it.  States that his bowels have improved.  He has a bowel movement every day without using any laxative, including Linzess.  FHx: neg for any gastrointestinal/liver disease, no  malignancies Social: Former smoker and used to drink on a weekly basis but quit 30 years ago, denies  illicit drug use Surgical: Cholecystectomy  Past Medical History: Past Medical History:  Diagnosis Date  . Chronic pain   . Diabetes mellitus without complication ([place])   . Diabetic neuropathy ([place]) 04/05/2016  . Essential hypertension, benign 04/05/2016  . High cholesterol 04/05/2016  . Hypercholesterolemia   . Hypertension   . Neuropathy     Past Surgical History: Past Surgical History:  Procedure Laterality Date  . CHOLECYSTECTOMY    . HERNIA REPAIR      Family History:History reviewed. No pertinent family history.  Social History: Social History   Tobacco Use  Smoking Status Former Smoker  . Packs/day: 2.00  . Types: Cigarettes  . Start date: 04/06/1960  . Quit date: 04/06/1989  . Years since quitting: 30.3  Smokeless Tobacco Never Used   Social History   Substance and Sexual Activity  Alcohol Use No   Social History   Substance and Sexual Activity  Drug Use No    Allergies: No Known Allergies  Medications: Current Outpatient Medications  Medication Sig Dispense Refill  . aspirin EC 81 MG tablet Take 81 mg by mouth every evening.    . DULoxetine (CYMBALTA) 60 MG capsule Take 60 mg by mouth 2 (two) times daily.     Marland Kitchen gabapentin (NEURONTIN) 600 MG tablet Take 600 mg by mouth 4 (four) times daily.    . insulin NPH-regular Human (NOVOLIN 70/30) (70-30) 100 UNIT/ML injection Inject 100 Units into the skin See admin instructions. 100 units in am and 100 units in pm    . OZEMPIC, 1 MG/DOSE,  2 MG/1.5ML SOPN Inject 1 mg into the skin every 7 (seven) days.    . rosuvastatin (CRESTOR) 20 MG tablet Take 20 mg by mouth every evening.     Marland Kitchen XTAMPZA ER 18 MG C12A Take 18 mg by mouth 2 (two) times a day.    . linaclotide (LINZESS) 290 MCG CAPS capsule Take 1 capsule (290 mcg total) by mouth daily before breakfast. (Patient not taking: Reported on 05/01/2018) 30 capsule 6  .  lisinopril (ZESTRIL) 20 MG tablet Take 20 mg by mouth 2 (two) times a day. (Patient not taking: Reported on 07/31/2019)     No current facility-administered medications for this visit.    Review of Systems: GENERAL: negative for malaise, night sweats HEENT: No changes in hearing or vision, no nose bleeds or other nasal problems. NECK: Negative for lumps, goiter, pain and significant neck swelling RESPIRATORY: Negative for cough, wheezing CARDIOVASCULAR: Negative for chest pain, leg swelling, palpitations, orthopnea GI: SEE HPI MUSCULOSKELETAL: Negative for joint pain or swelling, back pain, and muscle pain. SKIN: Negative for lesions, rash PSYCH: Negative for sleep disturbance, mood disorder and recent psychosocial stressors. HEMATOLOGY Negative for prolonged bleeding, bruising easily, and swollen nodes. ENDOCRINE: Negative for cold or heat intolerance, polyuria, polydipsia and goiter. NEURO: negative for tremor, gait imbalance, syncope and seizures. The remainder of the review of systems is noncontributory.   Physical Exam: BP (!) 149/75 (BP Location: Right Arm, Patient Position: Sitting, Cuff Size: Large)   Pulse 96   Temp 99 F (37.2 C) (Oral)   Ht 5\' 10"  (1.778 m)   Wt (!) 224 lb (101.6 kg)   BMI 32.14 kg/m  GENERAL: The patient is AO x3, in no acute distress. Obese. HEENT: Head is normocephalic and atraumatic. EOMI are intact. Mouth is well hydrated and without lesions. NECK: Supple. No masses LUNGS: Clear to auscultation. No presence of rhonchi/wheezing/rales. Adequate chest expansion HEART: RRR, normal s1 and s2. ABDOMEN: Soft, nontender, no guarding, no peritoneal signs, and nondistended. BS +. No masses. EXTREMITIES: Without any cyanosis, clubbing, rash, lesions or edema. NEUROLOGIC: AOx3, no focal motor deficit. SKIN: no jaundice, no rashes   Imaging/Labs: as above  I personally reviewed and interpreted the available labs, imaging and endoscopic  files.  Impression and Plan: Allen Nichols is a 74 y.o. 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  The patient has presented episodes of syncope and lightheadedness recently, which I consider have been secondary to hypoglycemic episodes in the setting of recent changes of his medication.  The patient was advised to follow-up with his PCP as soon as possible as a profound hypoglycemic event can lead to disastrous consequences.  He understood and agreed to call his PCP today.  Regarding his positive FOBT, the patient has not presented any clinical symptoms or signs of active gastrointestinal bleeding.  Due to this, we will proceed with a colonoscopy -the patient is due for a screening procedure as the last one was more than 10 years ago.  It is unclear if he had anemia in the past based on the available labs so I will order a CBC and iron stores today.  -Patient advised to reach his PCP urgently regarding possible episodes of hypoglycemia -Schedule colonoscopy -Perform blood workup  All questions were answered.      [name], MD Gastroenterology and Hepatology [facility]

## 2019-07-31 NOTE — Telephone Encounter (Signed)
Patient needs Plenvu (copay card) ° °

## 2019-07-31 NOTE — Patient Instructions (Addendum)
Please call your PCP urgently to inform him about your lightheadedness and blood sugar levels Schedule colonoscopy Perform blood workup

## 2019-08-01 ENCOUNTER — Telehealth (INDEPENDENT_AMBULATORY_CARE_PROVIDER_SITE_OTHER): Payer: Self-pay | Admitting: Gastroenterology

## 2019-08-01 LAB — IRON,TIBC AND FERRITIN PANEL
Ferritin: 34 ng/mL (ref 24–380)
Iron: 51 ug/dL (ref 50–180)

## 2019-08-01 LAB — CBC
HCT: 39.3 % (ref 38.5–50.0)
Hemoglobin: 13 g/dL — ABNORMAL LOW (ref 13.2–17.1)
MCH: 28.6 pg (ref 27.0–33.0)
MCHC: 33.1 g/dL (ref 32.0–36.0)
MCV: 86.4 fL (ref 80.0–100.0)
MPV: 9.5 fL (ref 7.5–12.5)
Platelets: 301 10*3/uL (ref 140–400)
RBC: 4.55 10*6/uL (ref 4.20–5.80)
RDW: 14.4 % (ref 11.0–15.0)
WBC: 7.6 10*3/uL (ref 3.8–10.8)

## 2019-08-01 NOTE — Telephone Encounter (Signed)
Called patient to inform him about his normal hemoglobin level and iron stores within normal limits (slightly above lower limit), the patient did not pick up the phone. Left detailed voice message.  [name], MD Gastroenterology and Hepatology [facility]

## 2019-08-12 ENCOUNTER — Other Ambulatory Visit (INDEPENDENT_AMBULATORY_CARE_PROVIDER_SITE_OTHER): Payer: Self-pay | Admitting: *Deleted

## 2019-08-15 NOTE — Patient Instructions (Addendum)
Your procedure is scheduled on: 08/22/2019  Report to [facility] at  7:30   AM.  Call this number if you have problems the morning of surgery: [phone]   Remember:              Follow Directions on the letter you received from Your Physician's office regarding the Bowel Prep              No Smoking the day of Procedure :   Take these medicines the morning of surgery with A SIP OF WATER: Cymbalta, gabapentin and xtampza if needed.  No diabetic medication am of procedure. Take 56 units of your 70/30  Insulin on Thursday evening.   Do not wear jewelry, make-up or nail polish.    Do not bring valuables to the hospital.  Contacts, dentures or bridgework may not be worn into surgery.  .   Patients discharged the day of surgery will not be allowed to drive home.     Colonoscopy, Adult, Care After This sheet gives you information about how to care for yourself after your procedure. Your health care provider may also give you more specific instructions. If you have problems or questions, contact your health care provider. What can I expect after the procedure? After the procedure, it is common to have:  A small amount of blood in your stool for 24 hours after the procedure.  Some gas.  Mild abdominal cramping or bloating.  Follow these instructions at home: General instructions   For the first 24 hours after the procedure: ? Do not drive or use machinery. ? Do not sign important documents. ? Do not drink alcohol. ? Do your regular daily activities at a slower pace than normal. ? Eat soft, easy-to-digest foods. ? Rest often.  Take over-the-counter or prescription medicines only as told by your health care provider.  It is up to you to get the results of your procedure. Ask your health care provider, or the department performing the procedure, when your results will be ready. Relieving cramping and bloating  Try walking around when you have cramps or feel bloated.  Apply  heat to your abdomen as told by your health care provider. Use a heat source that your health care provider recommends, such as a moist heat pack or a heating pad. ? Place a towel between your skin and the heat source. ? Leave the heat on for 20-30 minutes. ? Remove the heat if your skin turns bright red. This is especially important if you are unable to feel pain, heat, or cold. You may have a greater risk of getting burned. Eating and drinking  Drink enough fluid to keep your urine clear or pale yellow.  Resume your normal diet as instructed by your health care provider. Avoid heavy or fried foods that are hard to digest.  Avoid drinking alcohol for as long as instructed by your health care provider. Contact a health care provider if:  You have blood in your stool 2-3 days after the procedure. Get help right away if:  You have more than a small spotting of blood in your stool.  You pass large blood clots in your stool.  Your abdomen is swollen.  You have nausea or vomiting.  You have a fever.  You have increasing abdominal pain that is not relieved with medicine. This information is not intended to replace advice given to you by your health care provider. Make sure you discuss any questions you have  with your health care provider. Document Released: 08/03/2003 Document Revised: 09/13/2015 Document Reviewed: 03/02/2015 Elsevier Interactive Patient Education  [employer].

## 2019-08-20 ENCOUNTER — Other Ambulatory Visit: Payer: Self-pay

## 2019-08-20 ENCOUNTER — Other Ambulatory Visit (HOSPITAL_COMMUNITY)
Admission: RE | Admit: 2019-08-20 | Discharge: 2019-08-20 | Disposition: A | Discharge status: Home / Self Care | Payer: Medicare HMO | Source: Ambulatory Visit | Attending: Gastroenterology | Admitting: Gastroenterology

## 2019-08-20 ENCOUNTER — Encounter (HOSPITAL_COMMUNITY)
Admission: RE | Admit: 2019-08-20 | Discharge: 2019-08-20 | Disposition: A | Discharge status: Home / Self Care | Payer: Medicare HMO | Source: Ambulatory Visit | Attending: Gastroenterology | Admitting: Gastroenterology

## 2019-08-20 ENCOUNTER — Encounter (HOSPITAL_COMMUNITY): Payer: Self-pay

## 2019-08-20 DIAGNOSIS — Z01812 Encounter for preprocedural laboratory examination: Secondary | ICD-10-CM | POA: Diagnosis not present

## 2019-08-20 DIAGNOSIS — Z20822 Contact with and (suspected) exposure to covid-19: Secondary | ICD-10-CM | POA: Insufficient documentation

## 2019-08-20 HISTORY — DX: Myoneural disorder, unspecified: G70.9

## 2019-08-20 LAB — SARS CORONAVIRUS 2 (TAT 6-24 HRS): SARS Coronavirus 2: NEGATIVE

## 2019-08-22 ENCOUNTER — Ambulatory Visit (HOSPITAL_COMMUNITY): Payer: Medicare HMO | Admitting: Anesthesiology

## 2019-08-22 ENCOUNTER — Encounter (HOSPITAL_COMMUNITY): Payer: Self-pay | Admitting: Gastroenterology

## 2019-08-22 ENCOUNTER — Ambulatory Visit (HOSPITAL_COMMUNITY)
Admission: RE | Admit: 2019-08-22 | Discharge: 2019-08-22 | Disposition: A | Discharge status: Home / Self Care | Payer: Medicare HMO | Attending: Gastroenterology | Admitting: Gastroenterology

## 2019-08-22 ENCOUNTER — Other Ambulatory Visit: Payer: Self-pay

## 2019-08-22 ENCOUNTER — Encounter (HOSPITAL_COMMUNITY)
Admission: RE | Disposition: A | Discharge status: Home / Self Care | Payer: Self-pay | Source: Home / Self Care | Attending: Gastroenterology

## 2019-08-22 DIAGNOSIS — Z794 Long term (current) use of insulin: Secondary | ICD-10-CM | POA: Insufficient documentation

## 2019-08-22 DIAGNOSIS — Z79899 Other long term (current) drug therapy: Secondary | ICD-10-CM | POA: Insufficient documentation

## 2019-08-22 DIAGNOSIS — Z87891 Personal history of nicotine dependence: Secondary | ICD-10-CM | POA: Insufficient documentation

## 2019-08-22 DIAGNOSIS — G8929 Other chronic pain: Secondary | ICD-10-CM | POA: Insufficient documentation

## 2019-08-22 DIAGNOSIS — I10 Essential (primary) hypertension: Secondary | ICD-10-CM | POA: Insufficient documentation

## 2019-08-22 DIAGNOSIS — E1140 Type 2 diabetes mellitus with diabetic neuropathy, unspecified: Secondary | ICD-10-CM | POA: Diagnosis not present

## 2019-08-22 DIAGNOSIS — Z9119 Patient's noncompliance with other medical treatment and regimen: Secondary | ICD-10-CM

## 2019-08-22 DIAGNOSIS — R195 Other fecal abnormalities: Secondary | ICD-10-CM | POA: Insufficient documentation

## 2019-08-22 DIAGNOSIS — E7800 Pure hypercholesterolemia, unspecified: Secondary | ICD-10-CM | POA: Diagnosis not present

## 2019-08-22 DIAGNOSIS — E785 Hyperlipidemia, unspecified: Secondary | ICD-10-CM | POA: Diagnosis not present

## 2019-08-22 DIAGNOSIS — Z9049 Acquired absence of other specified parts of digestive tract: Secondary | ICD-10-CM | POA: Insufficient documentation

## 2019-08-22 DIAGNOSIS — Z7982 Long term (current) use of aspirin: Secondary | ICD-10-CM | POA: Diagnosis not present

## 2019-08-22 HISTORY — PX: FLEXIBLE SIGMOIDOSCOPY: SHX5431

## 2019-08-22 LAB — GLUCOSE, CAPILLARY
Glucose-Capillary: 92 mg/dL (ref 70–99)
Glucose-Capillary: 76 mg/dL (ref 70–99)

## 2019-08-22 SURGERY — SIGMOIDOSCOPY, FLEXIBLE
Anesthesia: General

## 2019-08-22 MED ORDER — STERILE WATER FOR IRRIGATION IR SOLN
Status: DC | PRN
  Administered 2019-08-22: 2.5 mL

## 2019-08-22 MED ORDER — LACTATED RINGERS IV SOLN: INTRAVENOUS | Status: DC | PRN

## 2019-08-22 MED ORDER — KETAMINE HCL 50 MG/5ML IJ SOSY
PREFILLED_SYRINGE | INTRAMUSCULAR | Status: AC
  Filled 2019-08-22: qty 5

## 2019-08-22 MED ORDER — PROPOFOL 500 MG/50ML IV EMUL
INTRAVENOUS | Status: DC | PRN
  Administered 2019-08-22: 40 mg via INTRAVENOUS

## 2019-08-22 MED ORDER — LACTATED RINGERS IV SOLN
Freq: Once | INTRAVENOUS | Status: AC
  Administered 2019-08-22: 1000 mL via INTRAVENOUS

## 2019-08-22 MED ORDER — KETAMINE HCL 10 MG/ML IJ SOLN
INTRAMUSCULAR | Status: DC | PRN
  Administered 2019-08-22 (×2): 10 mg via INTRAVENOUS

## 2019-08-22 NOTE — Discharge Instructions (Signed)
You are being discharged to home.  Resume your previous diet.  Your physician has recommended a repeat colonoscopy at the next available appointment because the bowel preparation was poor.    Colonoscopy, Adult, Care After This sheet gives you information about how to care for yourself after your procedure. Your doctor may also give you more specific instructions. If you have problems or questions, call your doctor. What can I expect after the procedure? After the procedure, it is common to have:  A small amount of blood in your poop (stool) for 24 hours.  Some gas.  Mild cramping or bloating in your belly (abdomen). Follow these instructions at home: Eating and drinking   Drink enough fluid to keep your pee (urine) pale yellow.  Follow instructions from your doctor about what you cannot eat or drink.  Return to your normal diet as told by your doctor. Avoid heavy or fried foods that are hard to digest. Activity  Rest as told by your doctor.  Do not sit for a long time without moving. Get up to take short walks every 1-2 hours. This is important. Ask for help if you feel weak or unsteady.  Return to your normal activities as told by your doctor. Ask your doctor what activities are safe for you. To help cramping and bloating:   Try walking around.  Put heat on your belly as told by your doctor. Use the heat source that your doctor recommends, such as a moist heat pack or a heating pad. ? Put a towel between your skin and the heat source. ? Leave the heat on for 20-30 minutes. ? Remove the heat if your skin turns bright red. This is very important if you are unable to feel pain, heat, or cold. You may have a greater risk of getting burned. General instructions  For the first 24 hours after the procedure: ? Do not drive or use machinery. ? Do not sign important documents. ? Do not drink alcohol. ? Do your daily activities more slowly than normal. ? Eat foods that are soft  and easy to digest.  Take over-the-counter or prescription medicines only as told by your doctor.  Keep all follow-up visits as told by your doctor. This is important. Contact a doctor if:  You have blood in your poop 2-3 days after the procedure. Get help right away if:  You have more than a small amount of blood in your poop.  You see large clumps of tissue (blood clots) in your poop.  Your belly is swollen.  You feel like you may vomit (nauseous).  You vomit.  You have a fever.  You have belly pain that gets worse, and medicine does not help your pain. Summary  After the procedure, it is common to have a small amount of blood in your poop. You may also have mild cramping and bloating in your belly.  For the first 24 hours after the procedure, do not drive or use machinery, do not sign important documents, and do not drink alcohol.  Get help right away if you have a lot of blood in your poop, feel like you may vomit, have a fever, or have more belly pain. This information is not intended to replace advice given to you by your health care provider. Make sure you discuss any questions you have with your health care provider. Document Revised: 07/15/2018 Document Reviewed: 07/15/2018 Elsevier Patient Education  [place].

## 2019-08-22 NOTE — Anesthesia Preprocedure Evaluation (Addendum)
Anesthesia Evaluation  Patient identified by MRN, date of birth, ID band Patient awake    Reviewed: Allergy & Precautions, NPO status , Patient's Chart, lab work & pertinent test results  Airway Mallampati: III  TM Distance: >3 FB Neck ROM: Full    Dental  (+) Dental Advisory Given, Missing   Pulmonary former smoker,    Pulmonary exam normal breath sounds clear to auscultation       Cardiovascular Exercise Tolerance: Good hypertension, Pt. on medications Normal cardiovascular exam Rhythm:Regular Rate:Normal     Neuro/Psych  Neuromuscular disease    GI/Hepatic negative GI ROS, Neg liver ROS,   Endo/Other  diabetes, Well Controlled, Type 2, Insulin Dependent  Renal/GU Renal InsufficiencyRenal disease     Musculoskeletal   Abdominal   Peds  Hematology   Anesthesia Other Findings   Reproductive/Obstetrics                            Anesthesia Physical Anesthesia Plan  ASA: III  Anesthesia Plan: General   Post-op Pain Management:    Induction: Intravenous  PONV Risk Score and Plan:   Airway Management Planned: Nasal Cannula and Natural Airway  Additional Equipment:   Intra-op Plan:   Post-operative Plan:   Informed Consent: I have reviewed the patients History and Physical, chart, labs and discussed the procedure including the risks, benefits and alternatives for the proposed anesthesia with the patient or authorized representative who has indicated his/her understanding and acceptance.     Dental advisory given  Plan Discussed with: CRNA and Surgeon  Anesthesia Plan Comments:         Anesthesia Quick Evaluation

## 2019-08-22 NOTE — H&P (Signed)
Allen Nichols is an 74 y.o. male.   Chief Complaint: Positive FOBT HPI: 74 y.o. 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  The patient denies having any complaints at the moment.  He has not noticed any blood in his stool such as melena or hematochezia, denies having any complaints.  He had FOBT testing as part of evaluation of syncopal episodes.  His last colonoscopy was performed 13 years ago, which was normal.  Patient had recent blood testing which was negative for anemia, iron stores were normal.  Past Medical History:  Diagnosis Date  . Chronic pain   . Diabetes mellitus without complication ([place])   . Diabetic neuropathy ([place]) 04/05/2016  . Essential hypertension, benign 04/05/2016  . High cholesterol 04/05/2016  . Hypercholesterolemia   . Hypertension   . Neuromuscular disorder ([place])   . Neuropathy     Past Surgical History:  Procedure Laterality Date  . CHOLECYSTECTOMY    . HERNIA REPAIR      History reviewed. No pertinent family history. Social History:  reports that he quit smoking about 30 years ago. His smoking use included cigarettes. He started smoking about 59 years ago. He smoked 2.00 packs per day. He has never used smokeless tobacco. He reports that he does not drink alcohol and does not use drugs.  Allergies: No Known Allergies  Medications Prior to Admission  Medication Sig Dispense Refill  . aspirin EC 81 MG tablet Take 81 mg by mouth every evening.    . DULoxetine (CYMBALTA) 60 MG capsule Take 60 mg by mouth 2 (two) times daily.     Marland Kitchen gabapentin (NEURONTIN) 600 MG tablet Take 600 mg by mouth 4 (four) times daily.    . insulin NPH-regular Human (NOVOLIN 70/30) (70-30) 100 UNIT/ML injection Inject 80 Units into the skin 2 (two) times daily with a meal.     . OZEMPIC, 1 MG/DOSE, 2 MG/1.5ML SOPN Inject 1 mg into the  skin every 7 (seven) days.    . rosuvastatin (CRESTOR) 20 MG tablet Take 20 mg by mouth every evening.     Marland Kitchen XTAMPZA ER 18 MG C12A Take 18 mg by mouth 2 (two) times a day.      Results for orders placed or performed during the hospital encounter of 08/22/19 (from the past 48 hour(s))  Glucose, capillary     Status: None   Collection Time: 08/22/19  7:20 AM  Result Value Ref Range   Glucose-Capillary 92 70 - 99 mg/dL    Comment: Glucose reference range applies only to samples taken after fasting for at least 8 hours.   No results found.  Review of Systems  Constitutional: Negative.   HENT: Negative.   Eyes: Negative.   Respiratory: Negative.   Cardiovascular: Negative.   Gastrointestinal: Negative.   Endocrine: Negative.   Genitourinary: Negative.   Musculoskeletal: Negative.   Skin: Negative.   Allergic/Immunologic: Negative.   Neurological: Negative.   Hematological: Negative.   Psychiatric/Behavioral: Negative.     Blood pressure (!) 178/77, pulse (!) 103, temperature 98.4 F (36.9 C), temperature source Oral, resp. rate 12, height 5\' 10"  (1.778 m), weight 97.5 kg, SpO2 98 %. Physical Exam  GENERAL: The patient is AO x3, in no acute distress. HEENT: Head is normocephalic and atraumatic. EOMI are intact. Mouth is well hydrated and without lesions. NECK: Supple. No masses LUNGS: Clear to auscultation. No presence  of rhonchi/wheezing/rales. Adequate chest expansion HEART: RRR, normal s1 and s2. ABDOMEN: Soft, nontender, no guarding, no peritoneal signs, and nondistended. BS +. No masses. EXTREMITIES: Without any cyanosis, clubbing, rash, lesions or edema. NEUROLOGIC: AOx3, no focal motor deficit. SKIN: no jaundice, no rashes  Assessment/Plan 74 y.o. 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  We will proceed with colonoscopy  today, as the patient is due for colorectal cancer screening.  Harvel Quale, MD 08/22/2019, 8:29 AM

## 2019-08-22 NOTE — Op Note (Signed)
[facility] Patient Name: Allen Nichols Procedure Date: 08/22/2019 8:34 AM MRN: [id number] Date of Birth: 1945-09-28 Attending MD: [name] ,  CSN: [id number] Age: 74 Admit Type: Outpatient Procedure:                Flexible Sigmoidoscopy Indications:              Positive FOBT Providers:                [name], Lurline Del, RN, Randa Spike,                            Technician Referring MD:              Medicines:                Monitored Anesthesia Care Complications:            No immediate complications. Estimated Blood Loss:     Estimated blood loss: none. Procedure:                Pre-Anesthesia Assessment:                           - Prior to the procedure, a History and Physical                            was performed, and patient medications, allergies                            and sensitivities were reviewed. The patient's                            tolerance of previous anesthesia was reviewed.                           - The risks and benefits of the procedure and the                            sedation options and risks were discussed with the                            patient. All questions were answered and informed                            consent was obtained.                           After obtaining informed consent, the scope was                            passed under direct vision. The PCF-[id number]                            ([id number]) was introduced through the anus and                            advanced to the the sigmoid colon. After obtaining  informed consent, the scope was passed under direct                            vision.The colonoscopy was technically difficult                            and complex due to poor bowel prep with stool                            present. The patient tolerated the procedure well.                            The quality of the bowel preparation was poor.                             Scope withdrawal time was 4 minutes. Scope In: 8:47:49 AM Scope Out: 8:51:00 AM Total Procedure Duration: 0 hours 3 minutes 11 seconds  Findings:      The perianal and digital rectal examinations were normal.      Extensive amounts of liquid stool was found in the rectum, in the       recto-sigmoid colon and in the sigmoid colon, precluding visualization. Impression:               - Stool in the rectum, in the recto-sigmoid colon                            and in the sigmoid colon.                           - No specimens collected. Moderate Sedation:      Per Anesthesia Care Recommendation:           - Discharge patient to home (ambulatory).                           - Resume previous diet.                           - Repeat colonoscopy at the next available                            appointment because the bowel preparation was poor                            - will require Glycolax/Golytely prep next time. Procedure Code(s):        --- Professional ---                           [phone], GC, Sigmoidoscopy, flexible; diagnostic,                            including collection of specimen(s) by brushing or                            washing, when performed (separate procedure) Diagnosis Code(s):        ---  Professional ---                           R19.5, Other fecal abnormalities CPT copyright 2019 American [employer] Association. All rights reserved. The codes documented in this report are preliminary and upon coder review may  be revised to meet current compliance requirements. [name], MD [name],  08/22/2019 9:41:30 AM This report has been signed electronically. Number of Addenda: 0

## 2019-08-22 NOTE — Transfer of Care (Signed)
Immediate Anesthesia Transfer of Care Note  Patient: Allen Nichols  Procedure(s) Performed: FLEXIBLE SIGMOIDOSCOPY  Patient Location: PACU  Anesthesia Type:MAC and General  Level of Consciousness: awake, alert , oriented and patient cooperative  Airway & Oxygen Therapy: Patient Spontanous Breathing  Post-op Assessment: Report given to RN and Post -op Vital signs reviewed and stable  Post vital signs: Reviewed and stable  Last Vitals:  Vitals Value Taken Time  BP    Temp    Pulse 87 08/22/19 0903  Resp 13 08/22/19 0903  SpO2 98 % 08/22/19 0903  Vitals shown include unvalidated device data.  Last Pain:  Vitals:   08/22/19 0729  TempSrc: Oral  PainSc: 0-No pain      Patients Stated Pain Goal: 8 (40/98/11 9147)  Complications: No complications documented.

## 2019-08-22 NOTE — Anesthesia Postprocedure Evaluation (Signed)
Anesthesia Post Note  Patient: Allen Nichols  Procedure(s) Performed: [place]  Patient location during evaluation: PACU Anesthesia Type: General and MAC Level of consciousness: awake and alert Pain management: pain level controlled Vital Signs Assessment: post-procedure vital signs reviewed and stable Respiratory status: spontaneous breathing Cardiovascular status: stable Postop Assessment: no apparent nausea or vomiting Anesthetic complications: no   No complications documented.   Last Vitals:  Vitals:   08/22/19 0729  BP: (!) 178/77  Pulse: (!) 103  Resp: 12  Temp: 36.9 C  SpO2: 98%    Last Pain:  Vitals:   08/22/19 0729  TempSrc: Oral  PainSc: 0-No pain                 Everette Rank

## 2019-08-26 ENCOUNTER — Telehealth (INDEPENDENT_AMBULATORY_CARE_PROVIDER_SITE_OTHER): Payer: Self-pay | Admitting: *Deleted

## 2019-08-26 ENCOUNTER — Encounter (HOSPITAL_COMMUNITY): Payer: Self-pay | Admitting: Gastroenterology

## 2019-08-26 NOTE — Telephone Encounter (Signed)
Patient was previously sch'd for TCS [phone]) 08/22/19 - Dr C wasn't able to perform TCS due to patient having still having stool so he done Flex SIg (01314) and asked for TCS to be resch'd with different prep - I called [employer] and spoke to [place] since [phone] was performed and not [phone] I can use PA for original TCS [id number] for resch'd TCS PA - call ref# [id number]

## 2019-08-26 NOTE — Telephone Encounter (Signed)
I called  Patient to reschedule TCS since he wasn't cleaned out good and he advised he did not want to reschedule TCS

## 2019-08-26 NOTE — Telephone Encounter (Signed)
Thanks Ann. Yes, I advised him to do so, he said he would consider it in the future but not for now.

## 2020-11-04 IMAGING — CT CT HEAD WITHOUT CONTRAST
3 series · 15 of 47 positions shown, 18 images · non-contrast
Comparison: None

CLINICAL DATA: Head trauma, passed out at [employer] today striking
LEFT side of head, headache

EXAM:
CT HEAD WITHOUT CONTRAST
TECHNIQUE: Contiguous axial images were obtained from the base of the skull
through the vertex without intravenous contrast. Sagittal and
coronal MPR images reconstructed from axial data set.

[Series 2: head wo · axial · 0.45mm/px · z∈[+1481,+1621]mm · 9 of 34 slices shown, 12 images]
[im 3/34  brain]
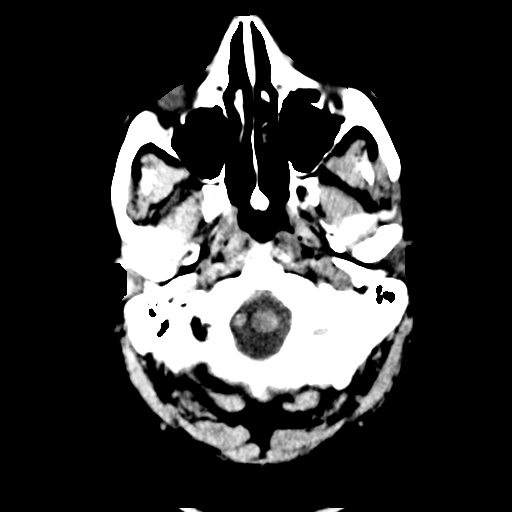
[im 3/34  bone]
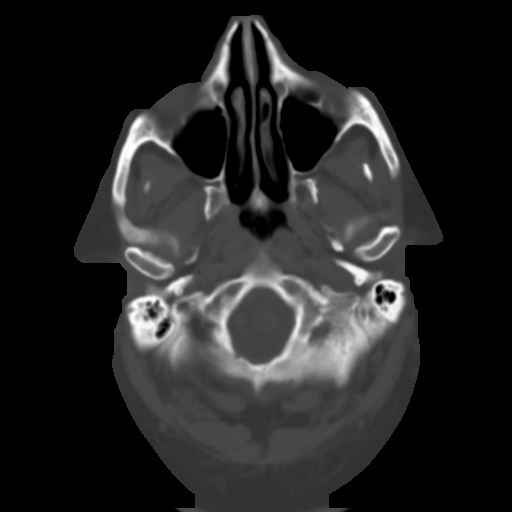
[im 6/34  brain]
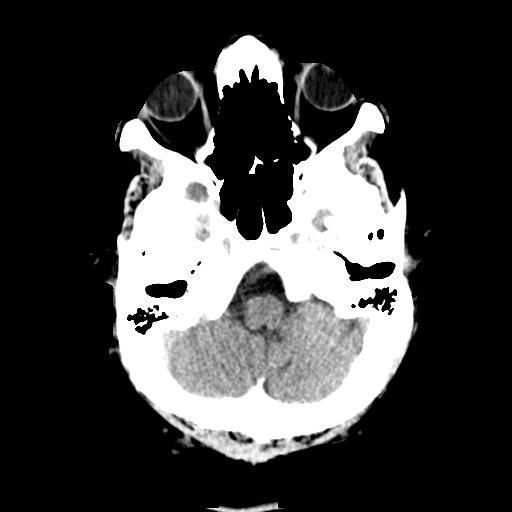
[im 10/34  brain]
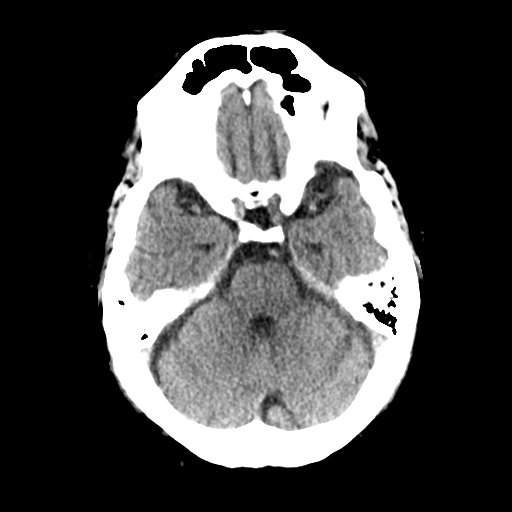
[im 13/34  brain]
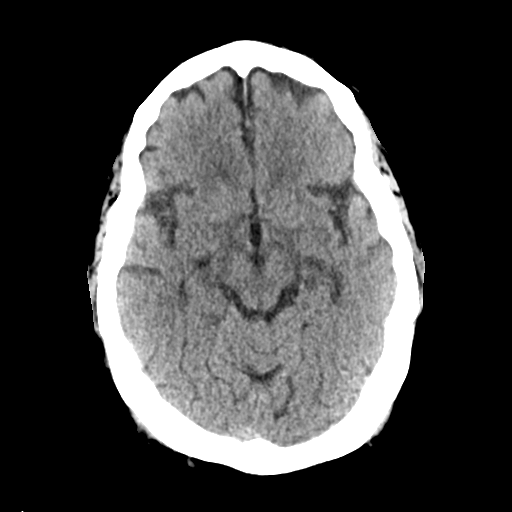
[im 18/34  brain]
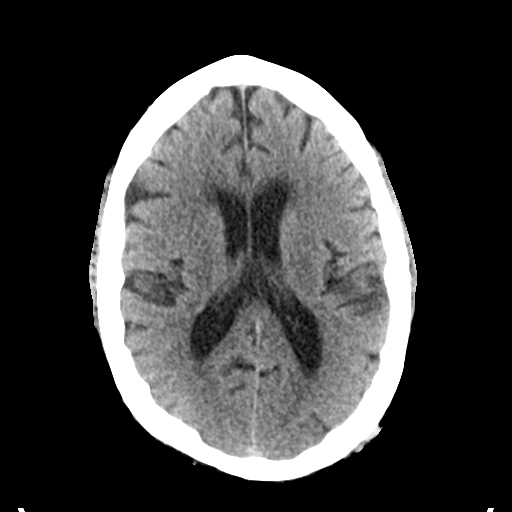
[im 18/34  bone]
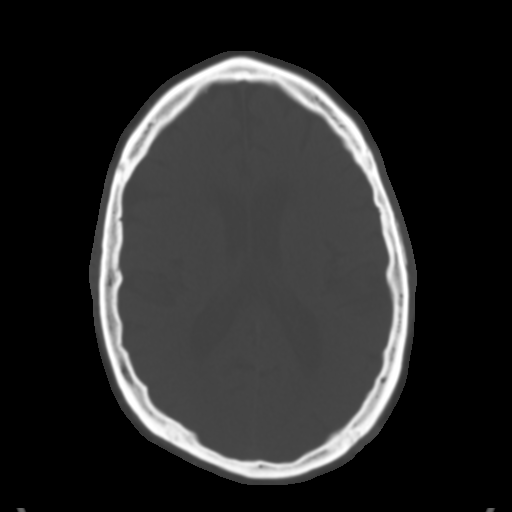
[im 21/34  brain]
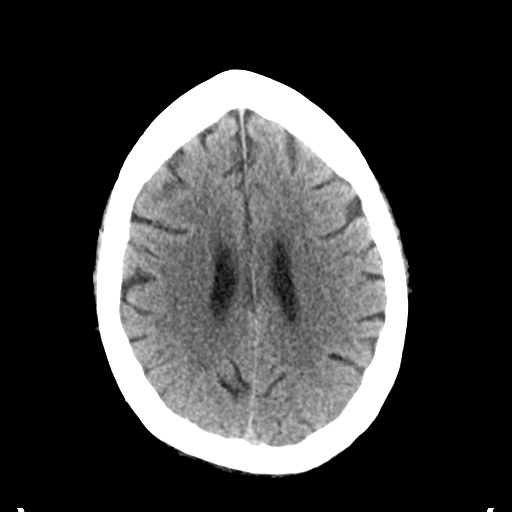
[im 24/34  brain]
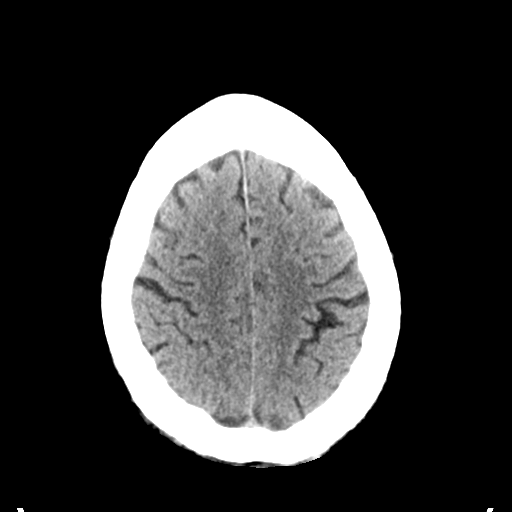
[im 28/34  brain]
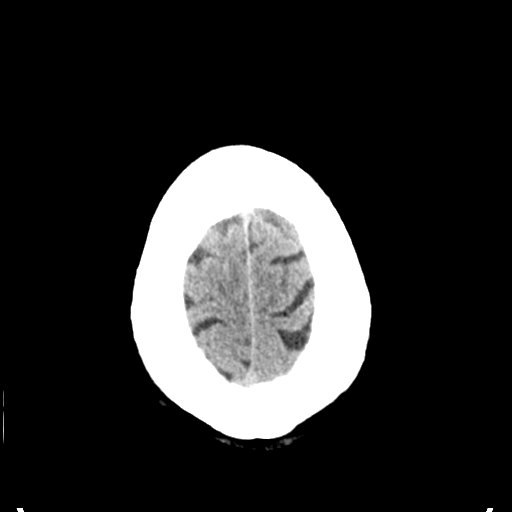
[im 31/34  brain]
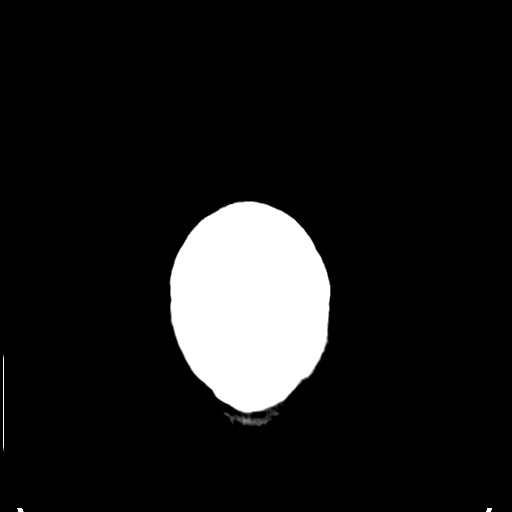
[im 31/34  bone]
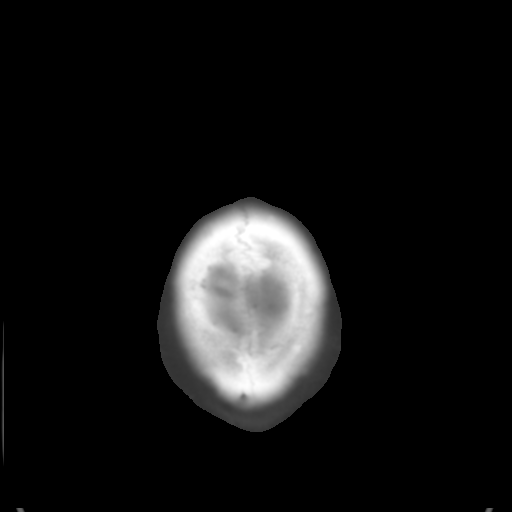

[Series 4: coronal soft tissue · coronal · 0.29mm/px · 3 of 68 slices shown]
[im 23/68  brain]
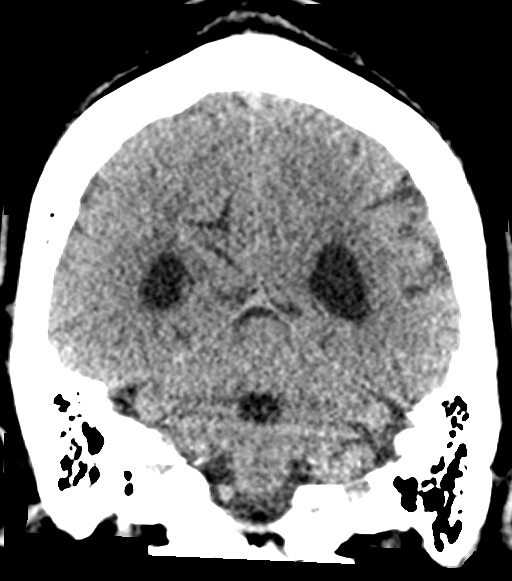
[im 30/68  brain]
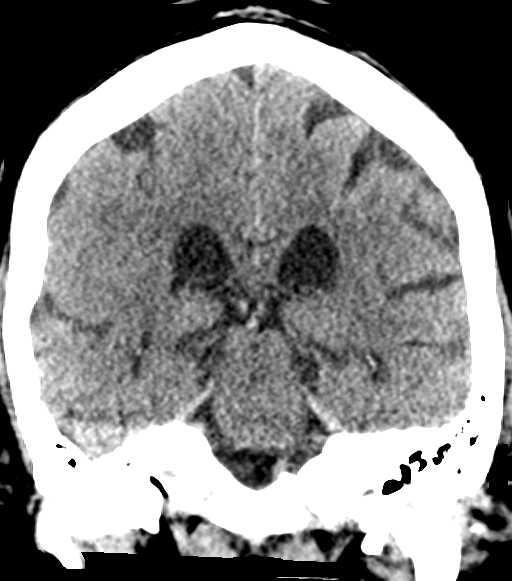
[im 38/68  brain]
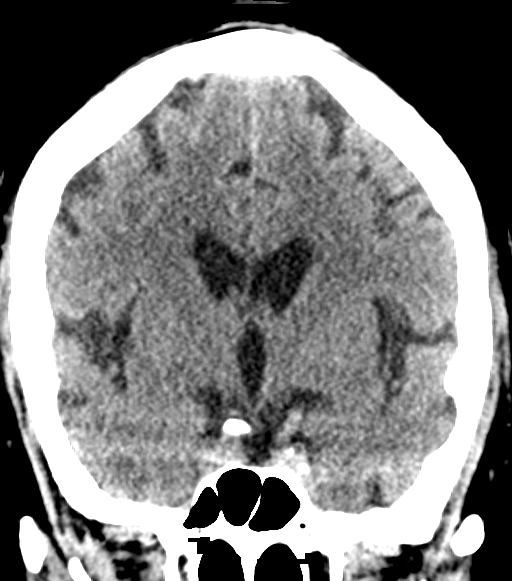

[Series 5: sagittal soft tissue · sagittal · 0.33mm/px · 3 of 50 slices shown]
[im 17/50  brain]
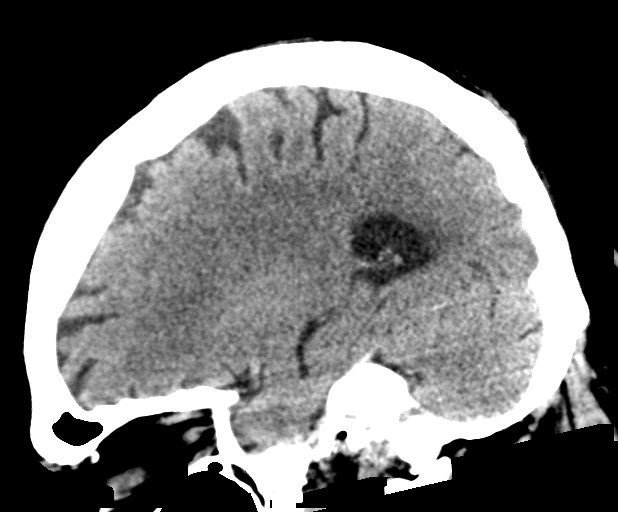
[im 25/50  brain]
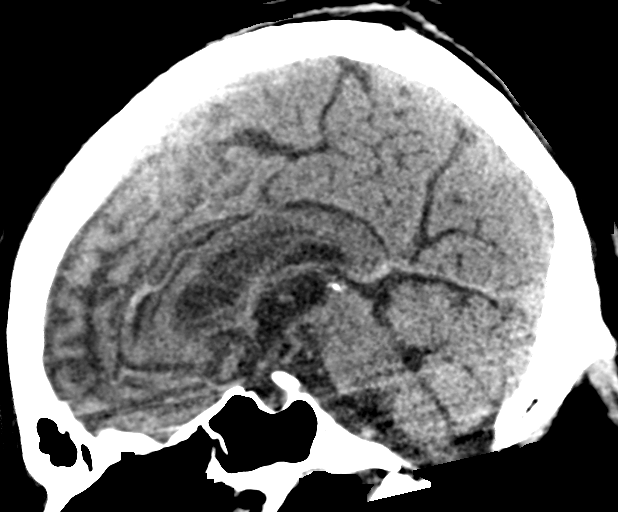
[im 33/50  brain]
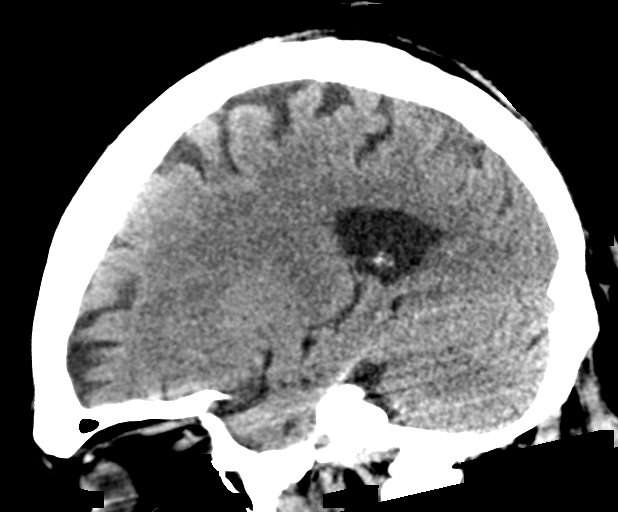

[15 of 47 positions shown; findings below may reference images not displayed]

FINDINGS: Brain: Generalized atrophy. Normal ventricular morphology. No
midline shift or mass effect. Minimal small vessel chronic ischemic
changes of deep cerebral white matter. No intracranial hemorrhage,
mass lesion, evidence of acute infarction, or extra-axial fluid
collection.

Vascular: Minimal atherosclerotic calcifications at the LEFT carotid
artery at skull base.

Skull: Intact

Sinuses/Orbits: Minimal mucous within LEFT sphenoid sinus. Otherwise
clear.

Other: N/A
IMPRESSION: Atrophy with minimal small vessel chronic ischemic changes of deep
cerebral white matter.

No acute intracranial abnormalities.

## 2021-02-02 ENCOUNTER — Ambulatory Visit: Payer: Medicare HMO | Admitting: Neurology

## 2021-02-21 ENCOUNTER — Ambulatory Visit: Payer: Medicare HMO | Admitting: Neurology

## 2021-03-24 ENCOUNTER — Ambulatory Visit: Payer: Medicare HMO | Admitting: Neurology

## 2021-11-02 ENCOUNTER — Emergency Department (HOSPITAL_COMMUNITY): Payer: Medicare HMO

## 2021-11-02 ENCOUNTER — Other Ambulatory Visit: Payer: Self-pay

## 2021-11-02 ENCOUNTER — Encounter (HOSPITAL_COMMUNITY): Payer: Self-pay | Admitting: Emergency Medicine

## 2021-11-02 ENCOUNTER — Emergency Department (HOSPITAL_COMMUNITY)
Admission: EM | Admit: 2021-11-02 | Discharge: 2021-11-02 | Disposition: A | Discharge status: Home / Self Care | Payer: Medicare HMO | Attending: Emergency Medicine | Admitting: Emergency Medicine

## 2021-11-02 DIAGNOSIS — R2681 Unsteadiness on feet: Secondary | ICD-10-CM | POA: Insufficient documentation

## 2021-11-02 DIAGNOSIS — R55 Syncope and collapse: Secondary | ICD-10-CM | POA: Diagnosis present

## 2021-11-02 DIAGNOSIS — Z794 Long term (current) use of insulin: Secondary | ICD-10-CM | POA: Diagnosis not present

## 2021-11-02 DIAGNOSIS — N179 Acute kidney failure, unspecified: Secondary | ICD-10-CM | POA: Diagnosis not present

## 2021-11-02 DIAGNOSIS — I959 Hypotension, unspecified: Secondary | ICD-10-CM | POA: Insufficient documentation

## 2021-11-02 DIAGNOSIS — R2689 Other abnormalities of gait and mobility: Secondary | ICD-10-CM

## 2021-11-02 DIAGNOSIS — Z7982 Long term (current) use of aspirin: Secondary | ICD-10-CM | POA: Diagnosis not present

## 2021-11-02 LAB — COMPREHENSIVE METABOLIC PANEL
ALT: 26 U/L (ref 0–44)
AST: 22 U/L (ref 15–41)
Albumin: 3.7 g/dL (ref 3.5–5.0)
Alkaline Phosphatase: 66 U/L (ref 38–126)
Anion gap: 11 (ref 5–15)
BUN: 13 mg/dL (ref 8–23)
CO2: 27 mmol/L (ref 22–32)
Calcium: 9.2 mg/dL (ref 8.9–10.3)
Chloride: 98 mmol/L (ref 98–111)
Creatinine, Ser: 1.86 mg/dL — ABNORMAL HIGH (ref 0.61–1.24)
GFR, Estimated: 38 mL/min — ABNORMAL LOW (ref >60)
Glucose, Bld: 174 mg/dL — ABNORMAL HIGH (ref 70–99)
Potassium: 3.6 mmol/L (ref 3.5–5.1)
Sodium: 136 mmol/L (ref 135–145)
Total Bilirubin: 0.5 mg/dL (ref 0.3–1.2)
Total Protein: 7.3 g/dL (ref 6.5–8.1)

## 2021-11-02 LAB — CBC
HCT: 37.6 % — ABNORMAL LOW (ref 39.0–52.0)
Hemoglobin: 12.1 g/dL — ABNORMAL LOW (ref 13.0–17.0)
MCH: 27.9 pg (ref 26.0–34.0)
MCHC: 32.2 g/dL (ref 30.0–36.0)
MCV: 86.6 fL (ref 80.0–100.0)
Platelets: 343 10*3/uL (ref 150–400)
RBC: 4.34 MIL/uL (ref 4.22–5.81)
RDW: 14.8 % (ref 11.5–15.5)
WBC: 9.6 10*3/uL (ref 4.0–10.5)
nRBC: 0 % (ref 0.0–0.2)

## 2021-11-02 LAB — CBG MONITORING, ED: Glucose-Capillary: 206 mg/dL — ABNORMAL HIGH (ref 70–99)

## 2021-11-02 LAB — MAGNESIUM: Magnesium: 2.2 mg/dL (ref 1.7–2.4)

## 2021-11-02 MED ORDER — SODIUM CHLORIDE 0.9 % IV BOLUS
1000.0000 mL | Freq: Once | INTRAVENOUS | Status: AC
  Administered 2021-11-02: 1000 mL via INTRAVENOUS

## 2021-11-02 MED ORDER — GADOBUTROL 1 MMOL/ML IV SOLN
10.0000 mL | Freq: Once | INTRAVENOUS | Status: AC | PRN
  Administered 2021-11-02: 10 mL via INTRAVENOUS

## 2021-11-02 NOTE — ED Notes (Signed)
Patient transported to MRI 

## 2021-11-02 NOTE — ED Triage Notes (Signed)
Pt was seen walking down hallway with unsteady gait, asked if pt needed w/c and said no and is here to see a patient. Sig other states earlier this year pt started falling around a lot but all of a sudden stopped. Pt was in room visiting and per [facility] pt was confused and trying to pump gas on the stretcher. Pt cbg checked and was 215. Pt was eating crackers and drinking juice just prior to cbg. Pt  alert to person/place, states it is November of 1922

## 2021-11-02 NOTE — ED Notes (Signed)
Pt's son called to come and pick up pt at pt is about to be discharged from ED

## 2021-11-02 NOTE — ED Notes (Signed)
CBG 217  

## 2021-11-02 NOTE — Discharge Instructions (Addendum)
Get help right away if: You faint. You have any of these symptoms that may indicate trouble with your heart: Fast or irregular heartbeats (palpitations). Unusual pain in your chest, abdomen, or back. Shortness of breath. You have a seizure. You have a severe headache. You are confused. You have vision problems. You have severe weakness or trouble walking. You are bleeding from your mouth or rectum, or have black or tarry stool.  Department of Motor Vehicle [facility]) of [place] regulations for seizures - It is the patient's responsibility to report the incidence of the seizure in the state of [place]. [place] has no statutory provision requiring physicians to report patients diagnosed with epilepsy or seizures to a central state agency.  The recommended DMV regulation requirement for a driver in [place] for an individual with a seizure is that they be seizure-free for 6-12 months. However, the DMV may consider the following exceptions to this general rule where: (1) a physician-directed change in medication causes a seizure and the individual immediately resumes the previous therapy which controlled seizures; (2) there is a history of nocturnal seizures or seizures which do not involve loss of consciousness, loss of control of motor function, or loss of appropriate sensation and information process; and (3) an individual has a seizure disorder preceded by an aura (warning) lasting 2-3 minutes. While the [facility] may also give consideration to other unusual circumstances which may affect the general requirement that drivers be seizure-free for 6-12 months, interpretation of these circumstances and assignment of restrictions is at the discretion of the Medical Advisor. The DMV also considers compliance with medical therapy essential for safe driving. [facility] Physician's Guide to [employer] (June, 1995 ed.)] The Department learns of an individual's condition by inquiring on the application form  or renewal form, a physician's report to the [facility], an accident report or from correspondence from the individual. The person may be required to submit a Medical Report Form either annually or semi-annually.  Do not drive, swim, take baths or do any other activities that would be dangerous if you had another seizure.   Contact a health care provider if: You have another seizure. You have seizures more often. Your seizure symptoms change. You continue to have seizures with treatment. You have symptoms of an infection or illness. They might increase your risk of having a seizure. Get help right away if: You have a seizure: That lasts longer than 5 minutes. That is different than previous seizures. That leaves you unable to speak or use a part of your body. That makes it harder to breathe. After a head injury. You have: Multiple seizures in a row. Confusion or a severe headache right after a seizure. You are having seizures more often. You do not wake up immediately after a seizure. You injure yourself during a seizure.

## 2021-11-02 NOTE — ED Provider Notes (Signed)
[facility] EMERGENCY DEPARTMENT Provider Note   CSN: [id number] Arrival date & time: 11/02/21  1317     History  Chief Complaint  Patient presents with   Altered Mental Status    Allen Nichols is a 76 y.o. male who was evaluated in the emergency department after having an unresponsive event as a visitor.  He should not was in the room with his girlfriend who was getting packaged for transfer with [facility].  He suddenly stopped talking and was standing at the side of the bed mimicking movements that appeared as if he were trying to pump gas into the stretcher.  Patient would not respond to any speech.  He had a blank stare.  This lasted for a few minutes.  Patient was somewhat confused initially however had quickly improving mental status and by the time I evaluated the patient was alert and oriented.  Nursing reports that he had some ataxic gait earlier however patient states that he has severe peripheral neuropathy and almost complete numbness in his feet and so it is abnormal for him to have some gait instability.  He states that he does not take drugs but occasionally takes an oxycodone for severe neuropathy pain in order to sleep at night and this is very infrequent.  He is on gabapentin daily.  He has no new medication changes.  He denies a history of seizures.  He does not remember the event that occurred earlier.   Altered Mental Status      Home Medications Prior to Admission medications   Medication Sig Start Date End Date Taking? Authorizing Provider  aspirin EC 81 MG tablet Take 81 mg by mouth every evening.   Yes [provider]  cyanocobalamin (VITAMIN B12) 1000 MCG tablet Take 2,000 mcg by mouth daily. 07/27/21  Yes [provider]  gabapentin (NEURONTIN) 600 MG tablet Take 1,200 mg by mouth 2 (two) times daily.   Yes [provider]  insulin NPH-regular Human (NOVOLIN 70/30) (70-30) 100 UNIT/ML injection Inject 80 Units into the skin 2 (two)  times daily with a meal.    Yes [provider]  lisinopril (ZESTRIL) 20 MG tablet Take 20 mg by mouth daily.   Yes [provider]  primidone (MYSOLINE) 50 MG tablet Take 100 mg by mouth at bedtime. 10/15/21  Yes [provider]  rosuvastatin (CRESTOR) 20 MG tablet Take 20 mg by mouth every evening.  12/02/14  Yes [provider]  XTAMPZA ER 18 MG C12A Take 18 mg by mouth 2 (two) times a day. 05/01/18  Yes [provider]  propranolol (INDERAL) 10 MG tablet Take 10 mg by mouth 3 (three) times daily. Patient not taking: Reported on 11/02/2021 06/15/21   [provider]      Allergies    Patient has no known allergies.    Review of Systems   Review of Systems  Physical Exam Updated Vital Signs BP (!) 146/72 (BP Location: Left Arm)   Pulse 80   Temp 98.5 F (36.9 C) (Oral)   Resp 16   SpO2 100%  Physical Exam Vitals and nursing note reviewed.  Constitutional:      General: He is not in acute distress.    Appearance: He is well-developed. He is not diaphoretic.  HENT:     Head: Normocephalic and atraumatic.  Eyes:     General: No scleral icterus.    Conjunctiva/sclera: Conjunctivae normal.  Cardiovascular:     Rate and Rhythm: Normal rate  and regular rhythm.     Heart sounds: Normal heart sounds.  Pulmonary:     Effort: Pulmonary effort is normal. No respiratory distress.     Breath sounds: Normal breath sounds.  Abdominal:     Palpations: Abdomen is soft.     Tenderness: There is no abdominal tenderness.  Musculoskeletal:     Cervical back: Normal range of motion and neck supple.  Skin:    General: Skin is warm and dry.  Neurological:     General: No focal deficit present.     Mental Status: He is alert.     Comments: Speech is clear and goal oriented, follows commands Major Cranial nerves without deficit, no facial droop Normal strength in upper and lower extremities bilaterally including dorsiflexion and plantar  flexion, strong and equal grip strength Sensation baseline for patient Moves extremities without ataxia, coordination intact Normal finger to nose and rapid alternating movements no pronator drift    Psychiatric:        Behavior: Behavior normal.     ED Results / Procedures / Treatments   Labs (all labs ordered are listed, but only abnormal results are displayed) Labs Reviewed  COMPREHENSIVE METABOLIC PANEL - Abnormal; Notable for the following components:      Result Value   Glucose, Bld 174 (*)    Creatinine, Ser 1.86 (*)    GFR, Estimated 38 (*)    All other components within normal limits  CBC - Abnormal; Notable for the following components:   Hemoglobin 12.1 (*)    HCT 37.6 (*)    All other components within normal limits  CBG MONITORING, ED - Abnormal; Notable for the following components:   Glucose-Capillary 206 (*)    All other components within normal limits  CBG MONITORING, ED - Abnormal; Notable for the following components:   Glucose-Capillary 215 (*)    All other components within normal limits  MAGNESIUM    EKG EKG Interpretation  Date/Time:  Wednesday November 02 2021 13:27:19 EDT Ventricular Rate:  93 PR Interval:  137 QRS Duration: 106 QT Interval:  356 QTC Calculation: 443 R Axis:   -25 Text Interpretation: Sinus rhythm Borderline left axis deviation RSR' in V1 or V2, probably normal variant Confirmed by Sherwood Gambler [phone]) on 11/02/2021 2:12:22 PM  Radiology MR Brain W and Wo Contrast  Result Date: 11/02/2021 CLINICAL DATA:  Delirium EXAM: MRI HEAD WITHOUT AND WITH CONTRAST TECHNIQUE: Multiplanar, multiecho pulse sequences of the brain and surrounding structures were obtained without and with intravenous contrast. CONTRAST:  75m GADAVIST GADOBUTROL 1 MMOL/ML IV SOLN COMPARISON:  CT head 05/01/2018 FINDINGS: Brain: There is no acute intracranial hemorrhage, extra-axial fluid collection, or acute infarct. There is mild background parenchymal  volume loss with prominence of the ventricular system and extra-axial CSF spaces. The ventricles are stable in size compared to the prior CT from 2020. There is mild periventricular FLAIR signal abnormality which is nonspecific but likely reflects sequela of mild chronic small vessel ischemic change. No evidence of prior cortical infarct or hemorrhage is seen. The pituitary and other midline structures are unremarkable. There is no mass lesion or abnormal enhancement. There is no mass effect or midline shift. Vascular: Normal flow voids. Skull and upper cervical spine: Normal marrow signal. Sinuses/Orbits: The paranasal sinuses are clear. The globes and orbits are unremarkable. Other: None. IMPRESSION: Unremarkable for age brain MRI with no acute intracranial pathology. Electronically Signed   By: PValetta MoleM.D.   On: 11/02/2021 16:17  Procedures Procedures    Medications Ordered in ED Medications  sodium chloride 0.9 % bolus 1,000 mL (0 mLs Intravenous Stopped 11/02/21 1811)  gadobutrol (GADAVIST) 1 MMOL/ML injection 10 mL (10 mLs Intravenous Contrast Given 11/02/21 1536)    ED Course/ Medical Decision Making/ A&P Clinical Course as of 11/04/21 1049  Wed Nov 02, 2021  1407 Case discussed with Dr. Leonel Ramsay.  He agrees that this sounds like likely a complex partial seizure.  Given the patient's age he recommends MRI of the brain with and without contrast.  Otherwise standard work-up for seizure.  Also recommends driving precautions. [AH]  1631 Comprehensive metabolic panel(!) [AH]  7989 Magnesium [AH]  1631 CBG monitoring, ED(!) [AH]  1631 CBC(!) [AH]  1631 MR Brain W and Wo Contrast I visualized and interpreted MRI of the brain which shows no acute findings [AH]  1632 EKG 12-Lead EKG shows sinus rhythm at a rate of 93 [AH]    Clinical Course User Index [AH] Margarita Mail, PA-C                           Medical Decision Making This patient presents to the ED for concern of ams,  this involves an extensive number of treatment options, and is a complaint that carries with it a high risk of complications and morbidity.  The differential diagnosis for AMS is extensive and includes, but is not limited to: drug overdose - opioids, alcohol, sedatives, antipsychotics, drug withdrawal, others; Metabolic: hypoxia, hypoglycemia, hyperglycemia, hypercalcemia, hypernatremia, hyponatremia, uremia, hepatic encephalopathy, hypothyroidism, hyperthyroidism, vitamin B12 or thiamine deficiency, carbon monoxide poisoning, Wilson's disease, Lactic acidosis, DKA/HHOS; Infectious: meningitis, encephalitis, bacteremia/sepsis, urinary tract infection, pneumonia, neurosyphilis; Structural: Space-occupying lesion, (brain tumor, subdural hematoma, hydrocephalus,); Vascular: stroke, subarachnoid hemorrhage, coronary ischemia, hypertensive encephalopathy, CNS vasculitis, thrombotic thrombocytopenic purpura, disseminated intravascular coagulation, hyperviscosity; Psychiatric: Schizophrenia, depression; Other: Seizure, hypothermia, heat stroke, ICU psychosis, dementia -"sundowning."      Co morbidities that complicate the patient evaluation       DM   Additional history obtained:  {Additional history obtained from Nursing staff  and family    Lab Tests:  I Ordered, and personally interpreted labs.  The pertinent results include:    Mild hyperglycemia Slighlty elevated cr  Imaging Studies ordered:  I ordered imaging studies including MRI brain w/wo I independently visualized and interpreted imaging which showed no acute findings I agree with the radiologist interpretation   Cardiac Monitoring/ECG:       The patient was maintained on a cardiac monitor.  I personally viewed and interpreted the cardiac monitored which showed an underlying rhythm of: nsr      Medicines ordered and prescription drug management:  I ordered medication including Medications sodium chloride 0.9 % bolus  1,000 mL (1,000 mLs Intravenous Bolus 11/02/21 1430) for aki hypotension Reevaluation of the patient after these medicines showed that the patient improved I have reviewed the patients home medicines and have made adjustments as needed      Critical Interventions:       fluid resucitations    Problem List / ED Course:  (R55) Near syncope  (primary encounter diagnosis)  (I95.9) Hypotension, unspecified hypotension type  (N17.9) AKI (acute kidney injury) ([place])  (R26.89) Poor balance      Reevaluation:  After the interventions noted above, I reevaluated the patient and found that they have :improved   Social Determinants of Health:       good family support  Recommend OP f/u for cr recheck and neuro follow up. Discussed f/u and return precautions at bedside with family.  Amount and/or Complexity of Data Reviewed Labs: ordered. Decision-making details documented in ED Course. Radiology: ordered and independent interpretation performed. Decision-making details documented in ED Course. ECG/medicine tests: ordered and independent interpretation performed. Decision-making details documented in ED Course. Discussion of management or test interpretation with external provider(s): Per ed course  Risk Prescription drug management.   After discussion with the patient's nurse, the patient, and family, I have higher suspicion for Near syncope then new onset complex partial seizure. Patient's nurse states that she was in the room and patient looked more like he was getting ready to lose consciousness. He had to be held up by his belt. She states that he was answering questions through the event.  He was aslo hypotensive with aki. Blood pressure improved with fluids. Negative orthostatics after resuscitation and was able to ambulate without dizziness . He has notable balance issues, but according to patient and fmily this appears to be his baseline.  I have discussed seizure  and driving precautions with the patient and his family. He is given referral to neurology because even if he did not have a seizure, hie could certainly use assessment for his gait and balance. Patient feels and appears much better and will be discharged.        Final Clinical Impression(s) / ED Diagnoses Final diagnoses:  Near syncope  Hypotension, unspecified hypotension type  AKI (acute kidney injury) ([place])  Poor balance    Rx / DC Orders ED Discharge Orders     None         Margarita Mail, PA-C 11/04/21 1050    Milton Ferguson, MD 11/04/21 1544

## 2021-11-02 NOTE — ED Notes (Signed)
Pt's BP and  MAP is cycling low, last BP 100/50 and MAP 64--PA-C made aware

## 2021-11-03 LAB — CBG MONITORING, ED: Glucose-Capillary: 215 mg/dL — ABNORMAL HIGH (ref 70–99)

## 2022-02-02 DIAGNOSIS — I5189 Other ill-defined heart diseases: Secondary | ICD-10-CM | POA: Diagnosis not present

## 2022-02-02 DIAGNOSIS — R251 Tremor, unspecified: Secondary | ICD-10-CM | POA: Diagnosis not present

## 2022-02-02 DIAGNOSIS — D649 Anemia, unspecified: Secondary | ICD-10-CM | POA: Diagnosis not present

## 2022-02-02 DIAGNOSIS — I1310 Hypertensive heart and chronic kidney disease without heart failure, with stage 1 through stage 4 chronic kidney disease, or unspecified chronic kidney disease: Secondary | ICD-10-CM | POA: Diagnosis not present

## 2022-02-02 DIAGNOSIS — E785 Hyperlipidemia, unspecified: Secondary | ICD-10-CM | POA: Diagnosis not present

## 2022-02-02 DIAGNOSIS — N1831 Chronic kidney disease, stage 3a: Secondary | ICD-10-CM | POA: Diagnosis not present

## 2022-02-02 DIAGNOSIS — G894 Chronic pain syndrome: Secondary | ICD-10-CM | POA: Diagnosis not present

## 2022-02-02 DIAGNOSIS — I679 Cerebrovascular disease, unspecified: Secondary | ICD-10-CM | POA: Diagnosis not present

## 2022-02-02 DIAGNOSIS — E1142 Type 2 diabetes mellitus with diabetic polyneuropathy: Secondary | ICD-10-CM | POA: Diagnosis not present

## 2022-03-15 DIAGNOSIS — Z794 Long term (current) use of insulin: Secondary | ICD-10-CM | POA: Diagnosis not present

## 2022-03-15 DIAGNOSIS — E785 Hyperlipidemia, unspecified: Secondary | ICD-10-CM | POA: Diagnosis not present

## 2022-03-15 DIAGNOSIS — I10 Essential (primary) hypertension: Secondary | ICD-10-CM | POA: Diagnosis not present

## 2022-03-15 DIAGNOSIS — E1142 Type 2 diabetes mellitus with diabetic polyneuropathy: Secondary | ICD-10-CM | POA: Diagnosis not present

## 2022-03-15 DIAGNOSIS — N1831 Chronic kidney disease, stage 3a: Secondary | ICD-10-CM | POA: Diagnosis not present

## 2022-04-04 DIAGNOSIS — Z79891 Long term (current) use of opiate analgesic: Secondary | ICD-10-CM | POA: Diagnosis not present

## 2022-04-04 DIAGNOSIS — E1142 Type 2 diabetes mellitus with diabetic polyneuropathy: Secondary | ICD-10-CM | POA: Diagnosis not present

## 2022-05-31 DIAGNOSIS — Z794 Long term (current) use of insulin: Secondary | ICD-10-CM | POA: Diagnosis not present

## 2022-05-31 DIAGNOSIS — I1310 Hypertensive heart and chronic kidney disease without heart failure, with stage 1 through stage 4 chronic kidney disease, or unspecified chronic kidney disease: Secondary | ICD-10-CM | POA: Diagnosis not present

## 2022-05-31 DIAGNOSIS — L309 Dermatitis, unspecified: Secondary | ICD-10-CM | POA: Diagnosis not present

## 2022-05-31 DIAGNOSIS — I679 Cerebrovascular disease, unspecified: Secondary | ICD-10-CM | POA: Diagnosis not present

## 2022-05-31 DIAGNOSIS — E785 Hyperlipidemia, unspecified: Secondary | ICD-10-CM | POA: Diagnosis not present

## 2022-05-31 DIAGNOSIS — R251 Tremor, unspecified: Secondary | ICD-10-CM | POA: Diagnosis not present

## 2022-05-31 DIAGNOSIS — D649 Anemia, unspecified: Secondary | ICD-10-CM | POA: Diagnosis not present

## 2022-05-31 DIAGNOSIS — E1142 Type 2 diabetes mellitus with diabetic polyneuropathy: Secondary | ICD-10-CM | POA: Diagnosis not present

## 2022-05-31 DIAGNOSIS — N1831 Chronic kidney disease, stage 3a: Secondary | ICD-10-CM | POA: Diagnosis not present

## 2022-07-12 DIAGNOSIS — I959 Hypotension, unspecified: Secondary | ICD-10-CM | POA: Diagnosis not present

## 2022-07-12 DIAGNOSIS — T148XXD Other injury of unspecified body region, subsequent encounter: Secondary | ICD-10-CM | POA: Diagnosis not present

## 2022-08-24 DIAGNOSIS — E1140 Type 2 diabetes mellitus with diabetic neuropathy, unspecified: Secondary | ICD-10-CM | POA: Diagnosis not present

## 2022-08-24 DIAGNOSIS — E1165 Type 2 diabetes mellitus with hyperglycemia: Secondary | ICD-10-CM | POA: Diagnosis not present

## 2022-08-24 DIAGNOSIS — E782 Mixed hyperlipidemia: Secondary | ICD-10-CM | POA: Diagnosis not present

## 2022-08-24 DIAGNOSIS — I10 Essential (primary) hypertension: Secondary | ICD-10-CM | POA: Diagnosis not present

## 2022-08-24 DIAGNOSIS — E119 Type 2 diabetes mellitus without complications: Secondary | ICD-10-CM | POA: Diagnosis not present

## 2022-08-24 DIAGNOSIS — G894 Chronic pain syndrome: Secondary | ICD-10-CM | POA: Diagnosis not present

## 2022-08-24 DIAGNOSIS — G250 Essential tremor: Secondary | ICD-10-CM | POA: Diagnosis not present

## 2022-08-24 DIAGNOSIS — Z79899 Other long term (current) drug therapy: Secondary | ICD-10-CM | POA: Diagnosis not present

## 2022-08-24 DIAGNOSIS — E538 Deficiency of other specified B group vitamins: Secondary | ICD-10-CM | POA: Diagnosis not present

## 2022-09-05 DIAGNOSIS — E1142 Type 2 diabetes mellitus with diabetic polyneuropathy: Secondary | ICD-10-CM | POA: Diagnosis not present

## 2022-09-05 DIAGNOSIS — Z794 Long term (current) use of insulin: Secondary | ICD-10-CM | POA: Diagnosis not present

## 2022-09-05 DIAGNOSIS — I10 Essential (primary) hypertension: Secondary | ICD-10-CM | POA: Diagnosis not present

## 2022-09-05 DIAGNOSIS — E785 Hyperlipidemia, unspecified: Secondary | ICD-10-CM | POA: Diagnosis not present

## 2022-09-05 DIAGNOSIS — N1831 Chronic kidney disease, stage 3a: Secondary | ICD-10-CM | POA: Diagnosis not present

## 2022-09-26 DIAGNOSIS — N1831 Chronic kidney disease, stage 3a: Secondary | ICD-10-CM | POA: Diagnosis not present

## 2022-09-26 DIAGNOSIS — Z794 Long term (current) use of insulin: Secondary | ICD-10-CM | POA: Diagnosis not present

## 2022-09-26 DIAGNOSIS — G894 Chronic pain syndrome: Secondary | ICD-10-CM | POA: Diagnosis not present

## 2022-09-26 DIAGNOSIS — I679 Cerebrovascular disease, unspecified: Secondary | ICD-10-CM | POA: Diagnosis not present

## 2022-09-26 DIAGNOSIS — E1142 Type 2 diabetes mellitus with diabetic polyneuropathy: Secondary | ICD-10-CM | POA: Diagnosis not present

## 2022-09-26 DIAGNOSIS — E785 Hyperlipidemia, unspecified: Secondary | ICD-10-CM | POA: Diagnosis not present

## 2022-09-26 DIAGNOSIS — R251 Tremor, unspecified: Secondary | ICD-10-CM | POA: Diagnosis not present

## 2022-09-26 DIAGNOSIS — I1310 Hypertensive heart and chronic kidney disease without heart failure, with stage 1 through stage 4 chronic kidney disease, or unspecified chronic kidney disease: Secondary | ICD-10-CM | POA: Diagnosis not present

## 2023-01-25 DIAGNOSIS — E538 Deficiency of other specified B group vitamins: Secondary | ICD-10-CM | POA: Diagnosis not present

## 2023-01-25 DIAGNOSIS — E559 Vitamin D deficiency, unspecified: Secondary | ICD-10-CM | POA: Diagnosis not present

## 2023-01-25 DIAGNOSIS — E1165 Type 2 diabetes mellitus with hyperglycemia: Secondary | ICD-10-CM | POA: Diagnosis not present

## 2023-01-25 DIAGNOSIS — I10 Essential (primary) hypertension: Secondary | ICD-10-CM | POA: Diagnosis not present

## 2023-02-07 DIAGNOSIS — Z794 Long term (current) use of insulin: Secondary | ICD-10-CM | POA: Diagnosis not present

## 2023-02-07 DIAGNOSIS — R251 Tremor, unspecified: Secondary | ICD-10-CM | POA: Diagnosis not present

## 2023-02-07 DIAGNOSIS — E785 Hyperlipidemia, unspecified: Secondary | ICD-10-CM | POA: Diagnosis not present

## 2023-02-07 DIAGNOSIS — I119 Hypertensive heart disease without heart failure: Secondary | ICD-10-CM | POA: Diagnosis not present

## 2023-02-07 DIAGNOSIS — L309 Dermatitis, unspecified: Secondary | ICD-10-CM | POA: Diagnosis not present

## 2023-02-07 DIAGNOSIS — D649 Anemia, unspecified: Secondary | ICD-10-CM | POA: Diagnosis not present

## 2023-02-07 DIAGNOSIS — E1142 Type 2 diabetes mellitus with diabetic polyneuropathy: Secondary | ICD-10-CM | POA: Diagnosis not present

## 2023-02-07 DIAGNOSIS — N1831 Chronic kidney disease, stage 3a: Secondary | ICD-10-CM | POA: Diagnosis not present

## 2023-04-04 DIAGNOSIS — I10 Essential (primary) hypertension: Secondary | ICD-10-CM | POA: Diagnosis not present

## 2023-04-04 DIAGNOSIS — E1142 Type 2 diabetes mellitus with diabetic polyneuropathy: Secondary | ICD-10-CM | POA: Diagnosis not present

## 2023-04-04 DIAGNOSIS — N1831 Chronic kidney disease, stage 3a: Secondary | ICD-10-CM | POA: Diagnosis not present

## 2023-04-04 DIAGNOSIS — Z794 Long term (current) use of insulin: Secondary | ICD-10-CM | POA: Diagnosis not present

## 2023-04-04 DIAGNOSIS — E785 Hyperlipidemia, unspecified: Secondary | ICD-10-CM | POA: Diagnosis not present

## 2023-05-17 ENCOUNTER — Other Ambulatory Visit: Payer: Self-pay

## 2023-05-17 ENCOUNTER — Encounter (HOSPITAL_COMMUNITY): Payer: Self-pay | Admitting: *Deleted

## 2023-05-17 ENCOUNTER — Emergency Department (HOSPITAL_COMMUNITY)
Admission: EM | Admit: 2023-05-17 | Discharge: 2023-05-17 | Disposition: A | Discharge status: Home / Self Care | Attending: Emergency Medicine | Admitting: Emergency Medicine

## 2023-05-17 DIAGNOSIS — R338 Other retention of urine: Secondary | ICD-10-CM

## 2023-05-17 DIAGNOSIS — E11649 Type 2 diabetes mellitus with hypoglycemia without coma: Secondary | ICD-10-CM | POA: Insufficient documentation

## 2023-05-17 DIAGNOSIS — R339 Retention of urine, unspecified: Secondary | ICD-10-CM | POA: Diagnosis not present

## 2023-05-17 DIAGNOSIS — N189 Chronic kidney disease, unspecified: Secondary | ICD-10-CM | POA: Diagnosis not present

## 2023-05-17 DIAGNOSIS — Z794 Long term (current) use of insulin: Secondary | ICD-10-CM | POA: Insufficient documentation

## 2023-05-17 DIAGNOSIS — E162 Hypoglycemia, unspecified: Secondary | ICD-10-CM | POA: Diagnosis not present

## 2023-05-17 DIAGNOSIS — I10 Essential (primary) hypertension: Secondary | ICD-10-CM | POA: Diagnosis not present

## 2023-05-17 DIAGNOSIS — E1122 Type 2 diabetes mellitus with diabetic chronic kidney disease: Secondary | ICD-10-CM | POA: Diagnosis not present

## 2023-05-17 DIAGNOSIS — I129 Hypertensive chronic kidney disease with stage 1 through stage 4 chronic kidney disease, or unspecified chronic kidney disease: Secondary | ICD-10-CM | POA: Diagnosis not present

## 2023-05-17 DIAGNOSIS — Z79899 Other long term (current) drug therapy: Secondary | ICD-10-CM | POA: Diagnosis not present

## 2023-05-17 DIAGNOSIS — R55 Syncope and collapse: Secondary | ICD-10-CM | POA: Diagnosis not present

## 2023-05-17 DIAGNOSIS — Z7982 Long term (current) use of aspirin: Secondary | ICD-10-CM | POA: Insufficient documentation

## 2023-05-17 DIAGNOSIS — R410 Disorientation, unspecified: Secondary | ICD-10-CM | POA: Diagnosis not present

## 2023-05-17 LAB — URINALYSIS, ROUTINE W REFLEX MICROSCOPIC
Bacteria, UA: NONE SEEN
Bilirubin Urine: NEGATIVE
Glucose, UA: >=500 mg/dL — AB
Ketones, ur: 5 mg/dL — AB
Leukocytes,Ua: NEGATIVE
Nitrite: NEGATIVE
Protein, ur: 30 mg/dL — AB
Specific Gravity, Urine: 1.023 (ref 1.005–1.030)
pH: 5 (ref 5.0–8.0)

## 2023-05-17 LAB — CBC WITH DIFFERENTIAL/PLATELET
Abs Immature Granulocytes: 0.04 10*3/uL (ref 0.00–0.07)
Basophils Absolute: 0.1 10*3/uL (ref 0.0–0.1)
Basophils Relative: 1 %
Eosinophils Absolute: 0 10*3/uL (ref 0.0–0.5)
Eosinophils Relative: 0 %
HCT: 45 % (ref 39.0–52.0)
Hemoglobin: 14.9 g/dL (ref 13.0–17.0)
Immature Granulocytes: 0 %
Lymphocytes Relative: 8 %
Lymphs Abs: 1 10*3/uL (ref 0.7–4.0)
MCH: 29.1 pg (ref 26.0–34.0)
MCHC: 33.1 g/dL (ref 30.0–36.0)
MCV: 87.9 fL (ref 80.0–100.0)
Monocytes Absolute: 0.4 10*3/uL (ref 0.1–1.0)
Monocytes Relative: 3 %
Neutro Abs: 11 10*3/uL — ABNORMAL HIGH (ref 1.7–7.7)
Neutrophils Relative %: 88 %
Platelets: 277 10*3/uL (ref 150–400)
RBC: 5.12 MIL/uL (ref 4.22–5.81)
RDW: 14.1 % (ref 11.5–15.5)
WBC: 12.6 10*3/uL — ABNORMAL HIGH (ref 4.0–10.5)
nRBC: 0 % (ref 0.0–0.2)

## 2023-05-17 LAB — CBG MONITORING, ED
Glucose-Capillary: 156 mg/dL — ABNORMAL HIGH (ref 70–99)
Glucose-Capillary: 145 mg/dL — ABNORMAL HIGH (ref 70–99)
Glucose-Capillary: 101 mg/dL — ABNORMAL HIGH (ref 70–99)
Glucose-Capillary: 110 mg/dL — ABNORMAL HIGH (ref 70–99)
Glucose-Capillary: 134 mg/dL — ABNORMAL HIGH (ref 70–99)

## 2023-05-17 LAB — COMPREHENSIVE METABOLIC PANEL WITH GFR
ALT: 25 U/L (ref 0–44)
AST: 22 U/L (ref 15–41)
Albumin: 3.5 g/dL (ref 3.5–5.0)
Alkaline Phosphatase: 63 U/L (ref 38–126)
Anion gap: 10 (ref 5–15)
BUN: 9 mg/dL (ref 8–23)
CO2: 25 mmol/L (ref 22–32)
Calcium: 8.5 mg/dL — ABNORMAL LOW (ref 8.9–10.3)
Chloride: 100 mmol/L (ref 98–111)
Creatinine, Ser: 0.99 mg/dL (ref 0.61–1.24)
GFR, Estimated: >60 mL/min (ref >60)
Glucose, Bld: 159 mg/dL — ABNORMAL HIGH (ref 70–99)
Potassium: 3.7 mmol/L (ref 3.5–5.1)
Sodium: 135 mmol/L (ref 135–145)
Total Bilirubin: 1.2 mg/dL (ref 0.0–1.2)
Total Protein: 6.8 g/dL (ref 6.5–8.1)

## 2023-05-17 LAB — I-STAT CHEM 8, ED
BUN: 8 mg/dL (ref 8–23)
Calcium, Ion: 1.06 mmol/L — ABNORMAL LOW (ref 1.15–1.40)
Chloride: 98 mmol/L (ref 98–111)
Creatinine, Ser: 0.9 mg/dL (ref 0.61–1.24)
Glucose, Bld: 109 mg/dL — ABNORMAL HIGH (ref 70–99)
HCT: 46 % (ref 39.0–52.0)
Hemoglobin: 15.6 g/dL (ref 13.0–17.0)
Potassium: 3.6 mmol/L (ref 3.5–5.1)
Sodium: 139 mmol/L (ref 135–145)
TCO2: 24 mmol/L (ref 22–32)

## 2023-05-17 MED ORDER — LIDOCAINE HCL URETHRAL/MUCOSAL 2 % EX GEL
1.0000 | Freq: Once | CUTANEOUS | Status: AC
  Administered 2023-05-17: 1 via URETHRAL

## 2023-05-17 MED ORDER — SODIUM CHLORIDE 0.9% FLUSH: 3.0000 mL | INTRAVENOUS | Status: DC | PRN

## 2023-05-17 MED ORDER — LORAZEPAM 0.5 MG PO TABS
0.5000 mg | ORAL_TABLET | Freq: Once | ORAL | Status: AC
  Administered 2023-05-17: 0.5 mg via ORAL
  Filled 2023-05-17: qty 1

## 2023-05-17 MED ORDER — SODIUM CHLORIDE 0.9 % IV BOLUS
500.0000 mL | Freq: Once | INTRAVENOUS | Status: AC
  Administered 2023-05-17: 500 mL via INTRAVENOUS

## 2023-05-17 MED ORDER — LIDOCAINE HCL (CARDIAC) PF 100 MG/5ML IV SOSY
PREFILLED_SYRINGE | INTRAVENOUS | Status: DC
Start: 2023-05-17 — End: 2023-05-18
  Filled 2023-05-17: qty 5

## 2023-05-17 MED ORDER — SODIUM CHLORIDE 0.9% FLUSH
3.0000 mL | Freq: Two times a day (BID) | INTRAVENOUS | Status: DC
  Administered 2023-05-17 (×2): 3 mL via INTRAVENOUS

## 2023-05-17 MED ORDER — SODIUM CHLORIDE 0.9 % IV SOLN: 250.0000 mL | INTRAVENOUS | Status: DC | PRN

## 2023-05-17 NOTE — ED Provider Notes (Signed)
 [name] EMERGENCY DEPARTMENT AT [facility] Provider Note   CSN: [id number] Arrival date & time: 05/17/23  1059     History  Chief Complaint  Patient presents with   Hypoglycemia    Allen Nichols is a 78 y.o. male.  HPI 78 year old male with a history of diabetes on insulin , hypertension, CKD, and other chronic comorbidities presents with hypoglycemia.  Patient's family (son and daughter-in-law) provide the history.  He lives with his girlfriend who called 911 because the patient was unresponsive.  His glucose was 50 and he was given glucagon IM.  Patient is now awake and alert and similar to his baseline which includes dementia.  Patient has not been eating and drinking well recently.  Otherwise, he is not altered from his baseline and has not been obviously sick.  His face is a little more red than typically though he often has dry skin, especially on his forehead.  Patient denies any acute complaints.  Daughter-in-law states that the patient's sugar is not usually checked and he is just given his meds as typical.  Home Medications Prior to Admission medications   Medication Sig Start Date End Date Taking? Authorizing Provider  aspirin EC 81 MG tablet Take 81 mg by mouth every evening.    [provider]  cyanocobalamin (VITAMIN B12) 1000 MCG tablet Take 2,000 mcg by mouth daily. 07/27/21   [provider]  gabapentin (NEURONTIN) 600 MG tablet Take 1,200 mg by mouth 2 (two) times daily.    [provider]  insulin  NPH-regular Human (NOVOLIN 70/30) (70-30) 100 UNIT/ML injection Inject 80 Units into the skin 2 (two) times daily with a meal.     [provider]  lisinopril (ZESTRIL) 20 MG tablet Take 20 mg by mouth daily.    [provider]  primidone (MYSOLINE) 50 MG tablet Take 100 mg by mouth at bedtime. 10/15/21   [provider]  propranolol (INDERAL) 10 MG tablet Take 10 mg by mouth 3 (three) times daily. Patient  not taking: Reported on 11/02/2021 06/15/21   [provider]  rosuvastatin  (CRESTOR ) 20 MG tablet Take 20 mg by mouth every evening.  12/02/14   [provider]  XTAMPZA  ER 18 MG C12A Take 18 mg by mouth 2 (two) times a day. 05/01/18   [provider]      Allergies    Patient has no known allergies.    Review of Systems   Review of Systems  Unable to perform ROS: Dementia    Physical Exam Updated Vital Signs BP (!) 142/67   Pulse 93   Temp 98.3 F (36.8 C) (Oral)   Resp 20   Ht 5\' 10"  (1.778 m)   Wt 81.6 kg   SpO2 94%   BMI 25.83 kg/m  Physical Exam Vitals and nursing note reviewed.  Constitutional:      Appearance: He is well-developed.  HENT:     Head: Normocephalic and atraumatic.     Comments: There is dry skin to the scalp.  He does have some erythema to his forehead and bilateral cheeks.  No significant increased warmth or fluctuance or induration. Eyes:     Extraocular Movements: Extraocular movements intact.     Pupils: Pupils are equal, round, and reactive to light.  Cardiovascular:     Rate and Rhythm: Normal rate and regular rhythm.     Heart sounds: Normal heart sounds.  Pulmonary:     Effort: Pulmonary effort is normal.  Breath sounds: Normal breath sounds.  Abdominal:     Palpations: Abdomen is soft.     Tenderness: There is no abdominal tenderness.  Skin:    General: Skin is warm and dry.  Neurological:     Mental Status: He is alert.     Comments: Awake, alert, clear speech. Chronic tremor with movement. 5/5 strength in all 4 extremities.     ED Results / Procedures / Treatments   Labs (all labs ordered are listed, but only abnormal results are displayed) Labs Reviewed  CBC WITH DIFFERENTIAL/PLATELET - Abnormal; Notable for the following components:      Result Value   WBC 12.6 (*)    Neutro Abs 11.0 (*)    All other components within normal limits  COMPREHENSIVE METABOLIC PANEL WITH GFR - Abnormal; Notable for  the following components:   Glucose, Bld 159 (*)    Calcium  8.5 (*)    All other components within normal limits  CBG MONITORING, ED - Abnormal; Notable for the following components:   Glucose-Capillary 156 (*)    All other components within normal limits  CBG MONITORING, ED - Abnormal; Notable for the following components:   Glucose-Capillary 145 (*)    All other components within normal limits  CBG MONITORING, ED - Abnormal; Notable for the following components:   Glucose-Capillary 101 (*)    All other components within normal limits  I-STAT CHEM 8, ED - Abnormal; Notable for the following components:   Glucose, Bld 109 (*)    Calcium , Ion 1.06 (*)    All other components within normal limits  URINALYSIS, ROUTINE W REFLEX MICROSCOPIC  CBG MONITORING, ED    EKG EKG Interpretation Date/Time:  Thursday May 17 2023 12:34:27 EDT Ventricular Rate:  90 PR Interval:    QRS Duration:  100 QT Interval:  357 QTC Calculation: 437 R Axis:   -42  Text Interpretation: sinus rhythm with artifact Left axis deviation RSR' in V1 or V2, probably normal variant similar to Nov 2023 Confirmed by Jerilynn Montenegro [phone]) on 05/17/2023 1:06:46 PM  Radiology No results found.  Procedures Procedures    Medications Ordered in ED Medications  sodium chloride  flush (NS) 0.9 % injection 3 mL (has no administration in time range)  sodium chloride  flush (NS) 0.9 % injection 3 mL (has no administration in time range)  0.9 %  sodium chloride  infusion (has no administration in time range)  sodium chloride  0.9 % bolus 500 mL (0 mLs Intravenous Stopped 05/17/23 1307)    ED Course/ Medical Decision Making/ A&P                                 Medical Decision Making Amount and/or Complexity of Data Reviewed Labs: ordered.    Details: Normal creatinine ECG/medicine tests: ordered and independent interpretation performed.    Details: No ischemia  Risk Prescription drug management.   Patient  presents with hypoglycemia.  Treated by EMS and has remained mildly high to normal though is slowly downtrending.  Will encourage him to eat better.  Sounds like he has not been eating well for quite some time but also does not check his glucose.  Likely this is related to poor p.o. intake in the setting of using insulin .  He does have glucometers, which his daughter-in-law confirmed, he just needs to use them to track his glucose.  Otherwise, he is not altered from baseline, has no abdominal  pain, vomiting, etc.  Urine is pending but otherwise he will need to be monitored to make sure his glucose does not keep going down. Care transferred to Dr. Drury Geralds.        Final Clinical Impression(s) / ED Diagnoses Final diagnoses:  None    Rx / DC Orders ED Discharge Orders     None         Jerilynn Montenegro, MD 05/17/23 1517

## 2023-05-17 NOTE — ED Notes (Signed)
 Writer reached out to MD about ordering PT since a consult was placed for Home health / DME . MD stated after patient tests, he will be DC and do not need that HH/DME. Consult cleared.

## 2023-05-17 NOTE — ED Triage Notes (Addendum)
 Pt BIB [facility] for hypoglycemia, cbg 50 , glucagon Im given and CBG 67, IV est 20 G to rt AC, D50 given, CBG 161, BP 186/82 Pt A&O to place, month and year, denies any pain.  + tremors, reported not new

## 2023-05-17 NOTE — ED Notes (Signed)
 Instructed pt for need for urine sample, urinal provided

## 2023-05-17 NOTE — Discharge Instructions (Signed)
 Thank you for coming to [facility] Emergency Department. You were seen for loss of consciousness and low blood sugar. Please do not take your insulin  tonight. Please check your blood sugars at least 1-2 times per day. Please follow up with your primary care physician about your insulin . You may need to decrease your insulin  dose if you continue to not eat as much.  You also has urinary retention. You were discharged with a foley catheter. Please follow up with a urologist Dr. Derrick Fling in clinic within 1 week. You can call his office in the morning to schedule an appointment.   Do not hesitate to return to the ED or call 911 if you experience: -Worsening symptoms -If the foley stops draining, is draining frank blood, or is leaking around the urethra -Low blood sugar  -Loss of consciousness, seizure-like activity -Lightheadedness, passing out -Fevers/chills -Anything else that concerns you

## 2023-05-17 NOTE — ED Provider Notes (Signed)
 3:14 PM Assumed care of patient from off-going team. For more details, please see note from same day.  In brief, this is a 78 y.o. male who was unresponsive this AM and found to be hypoglycemic CBG 50 mg/dL. Currently at his mental status baseline.   Plan/Dispo at time of sign-out & ED Course since sign-out: [ ]  another glucose check  [ ]  TOC consult pending, but could go home. Has girlfriend who cares for him. Son also checks on him frequently.   BP (!) 142/67   Pulse 93   Temp 98.3 F (36.8 C) (Oral)   Resp 20   Ht 5\' 10"  (1.778 m)   Wt 81.6 kg   SpO2 94%   BMI 25.83 kg/m    ED Course:   Clinical Course as of 05/17/23 2233  Thu May 17, 2023  1527 Glucose-Capillary(!): 101 Will repeat in 30 min [HN]  1630 WBC(!): 12.6 Leukocytosis w/ left shift [HN]  2017 Patient has been unable to urinate. He states he has no h/o prostate issues but bladder scan shows >400 cc in bladder. RN attempted to pass foley without success. Patient with apprehension about trying again with smaller catheter. Ordered urojet and small dose PO ativan [HN]  2232 Patient with 325 cc in foley catheter after insertion. Instructed to f/u with urology. No UTI, no fevers/chills. Blood sugar has stayed stable. Patient ready to be discharged. DC'd with catheter care instructions and specific return precautions. Instructed to f/u with PCP whitn 1 week related to insulin , instructed to hold insulin  tonight and to check BGs at least once but preferably 2-3 times per day, and to continue to eat well as long as he is taking insulin .  [HN]    Clinical Course User Index [HN] Merdis Stalling, MD   Dispo: DC w/ discharge instructions/return precautions. All questions answered to patient's satisfaction.   ------------------------------- Annita Kindle, MD Emergency Medicine  This note was created using dictation software, which may contain spelling or grammatical errors.   Merdis Stalling, MD 05/17/23 [phone]

## 2023-06-01 DIAGNOSIS — E782 Mixed hyperlipidemia: Secondary | ICD-10-CM | POA: Diagnosis not present

## 2023-06-01 DIAGNOSIS — Z0001 Encounter for general adult medical examination with abnormal findings: Secondary | ICD-10-CM | POA: Diagnosis not present

## 2023-06-01 DIAGNOSIS — Z7689 Persons encountering health services in other specified circumstances: Secondary | ICD-10-CM | POA: Diagnosis not present

## 2023-06-01 DIAGNOSIS — G250 Essential tremor: Secondary | ICD-10-CM | POA: Diagnosis not present

## 2023-06-01 DIAGNOSIS — Z0000 Encounter for general adult medical examination without abnormal findings: Secondary | ICD-10-CM | POA: Diagnosis not present

## 2023-06-01 DIAGNOSIS — I10 Essential (primary) hypertension: Secondary | ICD-10-CM | POA: Diagnosis not present

## 2023-06-01 DIAGNOSIS — G894 Chronic pain syndrome: Secondary | ICD-10-CM | POA: Diagnosis not present

## 2023-06-01 DIAGNOSIS — F0390 Unspecified dementia without behavioral disturbance: Secondary | ICD-10-CM | POA: Diagnosis not present

## 2023-06-01 DIAGNOSIS — E11649 Type 2 diabetes mellitus with hypoglycemia without coma: Secondary | ICD-10-CM | POA: Diagnosis not present

## 2023-06-05 NOTE — Progress Notes (Signed)
 Chief Complaint:   History of Present Illness:  Allen Nichols is a 78 y.o. male who is seen in consultation from Omie Bickers, MD for evaluation of urinary retention.  He is a long-term diabetic, currently insulin -dependent.  He does have diabetic neuropathies as well.  He went to the emergency room on the 15th of last month with mental status changes.  Found to have significant hypoglycemia.  He had an extended stay in the emergency room before his discharge home.  He did not void on request, was found to have a residual urine volume of 400 mL on his scan, catheter placed with 325 mL obtained.  The patient would like to have his catheter out.  He states that he never had a problem urinating before he went to the emergency room.  Long-term, he has no significant voiding symptoms, denies significant nocturia.  He states he has a good urinary stream.  He has never seen a urologist in the past..   Past Medical History:  Past Medical History:  Diagnosis Date   Chronic pain    Diabetes mellitus without complication (HCC)    Diabetic neuropathy (HCC) 04/05/2016   Essential hypertension, benign 04/05/2016   High cholesterol 04/05/2016   Hypercholesterolemia    Hypertension    Neuromuscular disorder (HCC)    Neuropathy     Past Surgical History:  Past Surgical History:  Procedure Laterality Date   CHOLECYSTECTOMY     FLEXIBLE SIGMOIDOSCOPY  08/22/2019   Procedure: FLEXIBLE SIGMOIDOSCOPY;  Surgeon: Urban Garden, MD;  Location: [facility];  Service: Gastroenterology;;   HERNIA REPAIR      Allergies:  No Known Allergies  Family History:  No family history on file.  Social History:  Social History   Tobacco Use   Smoking status: Former    Current packs/day: 0.00    Average packs/day: 2.0 packs/day for 29.0 years (58.0 ttl pk-yrs)    Types: Cigarettes    Start date: 04/06/1960    Quit date: 04/06/1989    Years since quitting: 34.1   Smokeless tobacco: Never   Vaping Use   Vaping status: Never Used  Substance Use Topics   Alcohol use: No   Drug use: No    Review of symptoms:  Constitutional:  Negative for unexplained weight loss, night sweats, fever, chills ENT:  Negative for nose bleeds, sinus pain, painful swallowing CV:  Negative for chest pain, shortness of breath, exercise intolerance, palpitations, loss of consciousness Resp:  Negative for cough, wheezing, shortness of breath GI:  Negative for nausea, vomiting, diarrhea, bloody stools GU:  Positives noted in HPI; otherwise negative for gross hematuria, dysuria, urinary incontinence Neuro:  Negative for seizures, poor balance, limb weakness, slurred speech Psych:  Negative for lack of energy, depression, anxiety Endocrine:  Negative for polydipsia, polyuria, symptoms of hypoglycemia (dizziness, hunger, sweating) Hematologic:  Negative for anemia, purpura, petechia, prolonged or excessive bleeding, use of anticoagulants  Allergic:  Negative for difficulty breathing or choking as a result of exposure to anything; no shellfish allergy; no allergic response (rash/itch) to materials, foods  Physical exam: There were no vitals taken for this visit. GENERAL APPEARANCE:  Well appearing, well developed, well nourished, NAD.  Slightly disheveled looking. HEENT: Atraumatic, Normocephalic. NECK: Normal appearance LUNGS: Normal inspiratory and expiratory excursion HEART: Regular Rate ABDOMEN: No inguinal hernias GU: Phallus not, sized catheter present at meatus.  Scrotal skin normal. Testicles/epididymal structures normal. Meatus normal. Normal anal sphincter tone, prostate 30 mL, symmetric,  non nodular, non tender. EXTREMITIES: Moves all extremities well.  Without clubbing, cyanosis, or edema. NEUROLOGIC:  Alert and oriented x 3, normal gait, CN II-XII grossly intact.  MENTAL STATUS:  Appropriate. SKIN:  Warm, dry and intact.    Results:  I have reviewed referring/prior physicians  notes--emergency room notes reviewed  I have reviewed urinalysis from emergency room visit-no evidence of infection.  Proteinuria and small amount of hemoglobin on dipstick noted.  Red blood cells on microscopic.  I have reviewed PSA results  I have reviewed prior imaging--bladder scan from emergency room revealed 400 mL  I have reviewed laboratories from emergency room visit.  Normal renal function.  Assessment: Urinary retention based on evaluation from emergency room visit 3 weeks ago.  He did not have a huge residual urine volume.  No longstanding urinary issues.  He passed his voiding trial today   Plan: I sent in 3 days of an antibiotic to cover his long-term catheter placement  I will have him come back on an as-needed basis

## 2023-06-06 ENCOUNTER — Ambulatory Visit (INDEPENDENT_AMBULATORY_CARE_PROVIDER_SITE_OTHER): Admitting: Urology

## 2023-06-06 VITALS — BP 118/71 | HR 83 | Ht 70.0 in | Wt 195.0 lb

## 2023-06-06 DIAGNOSIS — R339 Retention of urine, unspecified: Secondary | ICD-10-CM | POA: Diagnosis not present

## 2023-06-06 MED ORDER — CEPHALEXIN 500 MG PO CAPS: 500.0000 mg | ORAL_CAPSULE | Freq: Two times a day (BID) | ORAL | 0 refills | Status: AC

## 2023-07-09 DIAGNOSIS — Z794 Long term (current) use of insulin: Secondary | ICD-10-CM | POA: Diagnosis not present

## 2023-07-09 DIAGNOSIS — R251 Tremor, unspecified: Secondary | ICD-10-CM | POA: Diagnosis not present

## 2023-07-09 DIAGNOSIS — N1831 Chronic kidney disease, stage 3a: Secondary | ICD-10-CM | POA: Diagnosis not present

## 2023-07-09 DIAGNOSIS — E1142 Type 2 diabetes mellitus with diabetic polyneuropathy: Secondary | ICD-10-CM | POA: Diagnosis not present

## 2023-07-09 DIAGNOSIS — G894 Chronic pain syndrome: Secondary | ICD-10-CM | POA: Diagnosis not present

## 2023-07-09 DIAGNOSIS — E785 Hyperlipidemia, unspecified: Secondary | ICD-10-CM | POA: Diagnosis not present

## 2023-07-09 DIAGNOSIS — I739 Peripheral vascular disease, unspecified: Secondary | ICD-10-CM | POA: Diagnosis not present

## 2023-07-09 DIAGNOSIS — I1310 Hypertensive heart and chronic kidney disease without heart failure, with stage 1 through stage 4 chronic kidney disease, or unspecified chronic kidney disease: Secondary | ICD-10-CM | POA: Diagnosis not present

## 2023-09-04 DIAGNOSIS — I10 Essential (primary) hypertension: Secondary | ICD-10-CM | POA: Diagnosis not present

## 2023-09-04 DIAGNOSIS — F0390 Unspecified dementia without behavioral disturbance: Secondary | ICD-10-CM | POA: Diagnosis not present

## 2023-09-04 DIAGNOSIS — E782 Mixed hyperlipidemia: Secondary | ICD-10-CM | POA: Diagnosis not present

## 2023-09-04 DIAGNOSIS — R7301 Impaired fasting glucose: Secondary | ICD-10-CM | POA: Diagnosis not present

## 2023-09-12 DIAGNOSIS — G250 Essential tremor: Secondary | ICD-10-CM | POA: Diagnosis not present

## 2023-09-12 DIAGNOSIS — E162 Hypoglycemia, unspecified: Secondary | ICD-10-CM | POA: Diagnosis not present

## 2023-09-12 DIAGNOSIS — E782 Mixed hyperlipidemia: Secondary | ICD-10-CM | POA: Diagnosis not present

## 2023-09-12 DIAGNOSIS — F322 Major depressive disorder, single episode, severe without psychotic features: Secondary | ICD-10-CM | POA: Diagnosis not present

## 2023-09-12 DIAGNOSIS — E11649 Type 2 diabetes mellitus with hypoglycemia without coma: Secondary | ICD-10-CM | POA: Diagnosis not present

## 2023-09-12 DIAGNOSIS — G894 Chronic pain syndrome: Secondary | ICD-10-CM | POA: Diagnosis not present

## 2023-09-12 DIAGNOSIS — I10 Essential (primary) hypertension: Secondary | ICD-10-CM | POA: Diagnosis not present

## 2023-09-12 DIAGNOSIS — Z794 Long term (current) use of insulin: Secondary | ICD-10-CM | POA: Diagnosis not present

## 2023-09-12 DIAGNOSIS — F0390 Unspecified dementia without behavioral disturbance: Secondary | ICD-10-CM | POA: Diagnosis not present

## 2024-01-14 ENCOUNTER — Encounter: Payer: Self-pay | Admitting: Podiatry

## 2024-01-14 ENCOUNTER — Ambulatory Visit: Admitting: Podiatry

## 2024-01-14 DIAGNOSIS — B351 Tinea unguium: Secondary | ICD-10-CM

## 2024-01-14 DIAGNOSIS — L600 Ingrowing nail: Secondary | ICD-10-CM

## 2024-01-14 DIAGNOSIS — M79672 Pain in left foot: Secondary | ICD-10-CM

## 2024-01-14 DIAGNOSIS — M79671 Pain in right foot: Secondary | ICD-10-CM

## 2024-01-14 NOTE — Progress Notes (Signed)
 Patient presents for evaluation and treatment of tenderness and some redness around nails feet.  Tenderness around toes with walking and wearing shoes.  Complains of some burning in the feet at times  Physical exam:  General appearance: Alert, pleasant, and in no acute distress.  Vascular: Pedal pulses: DP 1/4 B/L, PT 0/4 B/L.  Mild to moderate edema lower legs bilaterally.  Capillary refill time immediate bilaterally  Neurologic: Light touch sensation intact feet bilaterally.  Severely diminished vibratory sensation in feet bilaterally.  Monofilament sensation diminished to toes 1 through 5 bilaterally.  Some sharp sensation still intact.  Decreased Achilles tendon reflex.  Dermatologic:  Nails thickened, disfigured, discolored 1-5 BL with subungual debris.  Redness and hypertrophic nail folds along nail folds bilaterally but no signs of drainage or infection.  Skin thin and atrophic with no hair growth lower extremity bilaterally.  Hallux nail right is somewhat torn from the nail border laterally on the hallux right.  Some fresh bleeding with dried blood also.  No signs of infection  Musculoskeletal:  Hammertoes 2 through 5 bilaterally.   Diagnosis: 1. Painful onychomycotic nails 1 through 5 bilaterally. 2. Pain toes 1 through 5 bilaterally. 3.  Ingrown nail lateral border hallux right  Plan: -New patient office visit for evaluation and management of the level 3.  Modifier 25. - Discussed then the ingrown nail on the hallux right.  Debrided nail border.  Instructed him to soak it twice a day DMin is lukewarm salt water , apply antibiotic ointment, and a light dressing.  If not healed after a week or 2 call us  so we can take a look at it.  Recommended periodic debridement of the nails every 3 months  -Debrided onychomycotic nails 1 through 5 bilaterally.  Sharply debrided nails with nail clipper and reduced with a power bur.  Return 3 months [facility]

## 2024-04-15 ENCOUNTER — Ambulatory Visit: Admitting: Podiatry
